# Patient Record
Sex: Male | Born: 1990 | Race: Black or African American | Hispanic: No | Marital: Single | Smoking: Never smoker
Health system: Southern US, Community
[De-identification: ages and names within clinical notes are randomized; demographics above are authoritative.]

---

## 2015-06-19 ENCOUNTER — Emergency Department (INDEPENDENT_AMBULATORY_CARE_PROVIDER_SITE_OTHER): Payer: Medicaid Other

## 2015-06-19 ENCOUNTER — Emergency Department (INDEPENDENT_AMBULATORY_CARE_PROVIDER_SITE_OTHER)
Admission: EM | Admit: 2015-06-19 | Discharge: 2015-06-19 | Disposition: A | Discharge status: Home / Self Care | Payer: Medicaid Other | Source: Home / Self Care | Attending: Emergency Medicine | Admitting: Emergency Medicine

## 2015-06-19 ENCOUNTER — Encounter (HOSPITAL_COMMUNITY): Payer: Self-pay | Admitting: Emergency Medicine

## 2015-06-19 DIAGNOSIS — M7652 Patellar tendinitis, left knee: Secondary | ICD-10-CM

## 2015-06-19 DIAGNOSIS — S8992XA Unspecified injury of left lower leg, initial encounter: Secondary | ICD-10-CM

## 2015-06-19 DIAGNOSIS — M25562 Pain in left knee: Secondary | ICD-10-CM

## 2015-06-19 MED ORDER — MELOXICAM 15 MG PO TABS: 15.0000 mg | ORAL_TABLET | Freq: Every day | ORAL | Status: DC

## 2015-06-19 NOTE — ED Provider Notes (Signed)
CSN: [id number]     Arrival date & time 06/19/15  1309 History   First MD Initiated Contact with Patient 06/19/15 1400     Chief Complaint  Patient presents with  . Knee Pain   (Consider location/radiation/quality/duration/timing/severity/associated sxs/prior Treatment) HPI He is a 25 year old man here for evaluation of left knee pain. A phone interpreter was used for this encounter. He states he hit his left knee on a chair about a week ago. He developed immediate pain inside the knee. He denies any swelling. He has had persistent pain. It is worse with weightbearing, particularly going up and down stairs. He has not tried any medicines. He is walking with a cane.  History reviewed. No pertinent past medical history. History reviewed. No pertinent past surgical history. No family history on file. Social History  Substance Use Topics  . Smoking status: Never Smoker   . Smokeless tobacco: None  . Alcohol Use: No    Review of Systems As in history of present illness Allergies  Review of patient's allergies indicates no known allergies.  Home Medications   Prior to Admission medications   Medication Sig Start Date End Date Taking? Authorizing Provider  OVER THE COUNTER MEDICATION vicks cream   Yes Historical Provider, MD  meloxicam (MOBIC) 15 MG tablet Take 1 tablet (15 mg total) by mouth daily. For 1 week, then as needed for pain 06/19/15   Charm Rings, MD   Meds Ordered and Administered this Visit  Medications - No data to display  BP 137/84 mmHg  Pulse 66  Temp(Src) 98.9 F (37.2 C)  Resp 16  SpO2 100% No data found.   Physical Exam  Constitutional: He is oriented to person, place, and time. He appears well-developed and well-nourished. No distress.  Cardiovascular: Normal rate.   Pulmonary/Chest: Effort normal.  Musculoskeletal:  Left knee: No erythema or edema. No appreciable joint effusion. He has full active range of motion, but pain with extension. He is tender  at the tibial tuberosity, over the patella, and the medial and lateral joint lines. No joint laxity.  Neurological: He is alert and oriented to person, place, and time.    ED Course  Procedures (including critical care time)  Labs Review Labs Reviewed - No data to display  Imaging Review Dg Knee Complete 4 Views Left  06/19/2015  CLINICAL DATA:  Bumped LEFT knee on edge of wooden table last week, pain at patella, knee injury, initial encounter EXAM: LEFT KNEE - COMPLETE 4+ VIEW COMPARISON:  None FINDINGS: Bone mineralization normal. Joint spaces preserved. No fracture, dislocation, or bone destruction. No joint effusion. IMPRESSION: Normal exam. Electronically Signed   By: Ulyses Southward M.D.   On: 06/19/2015 14:57      MDM   1. Left knee pain   2. Patellar tendinitis, left    X-ray negative for fracture. History and exam consistent with patellar tendinopathy. Treat with knee sleeve, rest, ice, and meloxicam. Expect gradual improvement over the next 2 weeks. Follow-up as needed.    Charm Rings, MD 06/19/15 [phone]

## 2015-06-19 NOTE — Discharge Instructions (Signed)
You have bruised your patellar tendon. Wear the knee sleeve for the next week. Ice your knee for 20 minutes at least 3 times a day. Do this after any sort of activity. Take meloxicam daily for 1 week, then as needed. This will help with the pain. If this is not improving in 2 weeks, please come back.  Vous avez bris votre tendon rotulien. Portez la [employer] genou pour la semaine suivante. Glacez votre genou pendant 20 minutes au moins 3 fois par jour. Faites ceci aprs n'importe quelle sorte d'activit. Prenez le meloxicam quotidiennement pendant 1 semaine, puis au besoin. [employer]. Si cela ne s'amliore pas dans 2 semaines, s'il vous plat revenir.
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
## 2015-06-19 NOTE — ED Notes (Signed)
[place]rived [place] 03[place]

## 2015-06-19 NOTE — ED Notes (Signed)
Note from congregational nurse sent for scanning into record
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
## 2015-06-20 DIAGNOSIS — S8992XA Unspecified injury of left lower leg, initial encounter: Secondary | ICD-10-CM

## 2015-06-26 NOTE — Congregational Nurse Program (Signed)
Congregational Nurse Program Note  Date of Encounter: 06/20/2015  Past Medical History: No past medical history on file.  Encounter Details:     CNP Questionnaire - 06/20/15 2342    Patient Demographics   Is this a new or existing patient? New   Patient is considered a/an Refugee   Race African   Patient Assistance   Location of Patient Assistance Not Applicable   Patient's financial/insurance status Medicaid   Uninsured Patient No   Patient referred to apply for the following financial assistance Not Applicable   Food insecurities addressed Not Applicable   Transportation assistance Yes   Type of Assistance Taxi Voucher Given   Assistance securing medications No   Educational health offerings Navigating the healthcare system;Safety   Encounter Details   Primary purpose of visit Acute Illness/Condition Visit;Navigating the Healthcare System   Was an Emergency Department visit averted? Not Applicable   Does patient have a medical provider? No   Patient referred to Urgent Care   Was a mental health screening completed? (GAINS tool) No   Does patient have dental issues? No   Does patient have vision issues? No   Since previous encounter, have you referred patient for abnormal blood pressure that resulted in a new diagnosis or medication change? No   Since previous encounter, have you referred patient for abnormal blood glucose that resulted in a new diagnosis or medication change? No     Office visit to confirm [facility] and [facility].Reports knee improved with medication, Meloxicam, ice application, elevation and brace on left knee                 .Plan:  Reinforced treatment and return as needed. Assist in locating PCP. Ferol LuzMarietta Norbert Malkin, RN/CN

## 2015-07-17 ENCOUNTER — Encounter (HOSPITAL_COMMUNITY): Payer: Self-pay | Admitting: Emergency Medicine

## 2015-07-17 ENCOUNTER — Emergency Department (INDEPENDENT_AMBULATORY_CARE_PROVIDER_SITE_OTHER)
Admission: EM | Admit: 2015-07-17 | Discharge: 2015-07-17 | Disposition: A | Discharge status: Home / Self Care | Payer: Self-pay | Source: Home / Self Care | Attending: Family Medicine | Admitting: Family Medicine

## 2015-07-17 ENCOUNTER — Emergency Department (INDEPENDENT_AMBULATORY_CARE_PROVIDER_SITE_OTHER): Payer: Medicaid Other

## 2015-07-17 DIAGNOSIS — T148 Other injury of unspecified body region: Secondary | ICD-10-CM | POA: Diagnosis not present

## 2015-07-17 DIAGNOSIS — T148XXA Other injury of unspecified body region, initial encounter: Secondary | ICD-10-CM

## 2015-07-17 NOTE — Discharge Instructions (Signed)
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
## 2015-12-16 ENCOUNTER — Emergency Department (HOSPITAL_COMMUNITY): Payer: No Typology Code available for payment source

## 2015-12-16 ENCOUNTER — Emergency Department (HOSPITAL_COMMUNITY)
Admission: EM | Admit: 2015-12-16 | Discharge: 2015-12-16 | Disposition: A | Discharge status: Home / Self Care | Payer: No Typology Code available for payment source | Attending: Emergency Medicine | Admitting: Emergency Medicine

## 2015-12-16 ENCOUNTER — Encounter (HOSPITAL_COMMUNITY): Payer: Self-pay | Admitting: *Deleted

## 2015-12-16 DIAGNOSIS — S8991XA Unspecified injury of right lower leg, initial encounter: Secondary | ICD-10-CM | POA: Diagnosis not present

## 2015-12-16 DIAGNOSIS — Y999 Unspecified external cause status: Secondary | ICD-10-CM | POA: Diagnosis not present

## 2015-12-16 DIAGNOSIS — Y9389 Activity, other specified: Secondary | ICD-10-CM | POA: Diagnosis not present

## 2015-12-16 DIAGNOSIS — M25561 Pain in right knee: Secondary | ICD-10-CM | POA: Diagnosis present

## 2015-12-16 DIAGNOSIS — Y92410 Unspecified street and highway as the place of occurrence of the external cause: Secondary | ICD-10-CM | POA: Insufficient documentation

## 2015-12-16 DIAGNOSIS — Z79899 Other long term (current) drug therapy: Secondary | ICD-10-CM | POA: Insufficient documentation

## 2015-12-16 MED ORDER — NAPROXEN 500 MG PO TABS: 500.0000 mg | ORAL_TABLET | Freq: Two times a day (BID) | ORAL | 0 refills | Status: DC

## 2015-12-16 MED ORDER — OXYCODONE-ACETAMINOPHEN 5-325 MG PO TABS
1.0000 | ORAL_TABLET | Freq: Once | ORAL | Status: AC
  Administered 2015-12-16: 1 via ORAL
  Filled 2015-12-16: qty 1

## 2015-12-16 MED ORDER — HYDROCODONE-ACETAMINOPHEN 5-325 MG PO TABS: 1.0000 | ORAL_TABLET | Freq: Four times a day (QID) | ORAL | 0 refills | Status: DC | PRN

## 2015-12-16 MED ORDER — KETOROLAC TROMETHAMINE 60 MG/2ML IM SOLN
60.0000 mg | Freq: Once | INTRAMUSCULAR | Status: AC
  Administered 2015-12-16: 60 mg via INTRAMUSCULAR
  Filled 2015-12-16: qty 2
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
## 2015-12-16 NOTE — ED Notes (Signed)
Bed: [id number] Expected date:  Expected time:  Means of arrival:  Comments: EMS-fast track

## 2015-12-16 NOTE — Discharge Instructions (Signed)
There were no abnormalities on your x-ray. Follow-up with orthopedics as needed for reevaluation and chronic management of this injury Expect your soreness to increase over the next 2-3 days. Take it easy, but do not lay around too much as this may make the stiffness worse. Take 500 mg of naproxen every 12 hours or 800 mg of ibuprofen every 8 hours for the next 3 days. Take these medications with food to avoid upset stomach. Vicodin for severe pain. Do not take the Vicodin while driving or performing other dangerous activities.
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
## 2017-04-22 ENCOUNTER — Other Ambulatory Visit: Payer: Self-pay

## 2017-04-22 ENCOUNTER — Ambulatory Visit (HOSPITAL_COMMUNITY)
Admission: EM | Admit: 2017-04-22 | Discharge: 2017-04-22 | Disposition: A | Discharge status: Home / Self Care | Payer: BLUE CROSS/BLUE SHIELD | Attending: Family Medicine | Admitting: Family Medicine

## 2017-04-22 ENCOUNTER — Encounter (HOSPITAL_COMMUNITY): Payer: Self-pay | Admitting: Emergency Medicine

## 2017-04-22 DIAGNOSIS — J029 Acute pharyngitis, unspecified: Secondary | ICD-10-CM | POA: Diagnosis present

## 2017-04-22 LAB — POCT RAPID STREP A: Streptococcus, Group A Screen (Direct): NEGATIVE

## 2017-04-22 NOTE — ED Triage Notes (Signed)
Pt has been suffering from a fever and sore throat for four days.  He was seen by an RN at his work and he is here today to be cleared to work or to be excused based on our final diagnosis.  Pt has paperwork with him to bring back to work.

## 2017-04-25 LAB — CULTURE, GROUP A STREP (THRC)

## 2017-04-25 NOTE — ED Provider Notes (Signed)
  [facility]   [id number] 04/22/17 Arrival Time: 1318  ASSESSMENT & PLAN:  1. Sore throat     Results for orders placed or performed during the hospital encounter of 04/22/17  Culture, group A strep  POCT rapid strep A [facility])  Result Value Ref Range   Streptococcus, Group A Screen (Direct) NEGATIVE NEGATIVE   Labs Reviewed  CULTURE, GROUP A STREP [facility])  POCT RAPID STREP A   OTC analgesics and throat care as needed. Will notify of any positive culture results. Work note forms completed. Will follow up if not showing significant improvement over the next 24-48 hours.  Reviewed expectations re: course of current medical issues. Questions answered. Outlined signs and symptoms indicating need for more acute intervention. Patient verbalized understanding. After Visit Summary given.   SUBJECTIVE:  Sean Weiss is a 27 y.o. male who reports a sore throat. Describes as sharp. Onset abrupt beginning 4 days ago. No respiratory symptoms. Normal PO intake but reports discomfort with swallowing. No associated n/v/abdominal symptoms. Sick contacts: None. OTC Tylenol with mild help. No specific worsening of current symptoms.   ROS: As per HPI.   OBJECTIVE:  Vitals:   04/22/17 1415  BP: 127/81  Pulse: 68  Temp: 98.6 F (37 C)  TempSrc: Oral  SpO2: 100%     General appearance: alert; no distress HEENT: throat with moderate erythema Neck: supple with FROM; no cervical LAD Lungs: clear to auscultation bilaterally Skin: reveals no rash; warm and dry Psychological: alert and cooperative; normal mood and affect  No Known Allergies   Social History   Socioeconomic History  . Marital status: Single    Spouse name: Not on file  . Number of children: Not on file  . Years of education: Not on file  . Highest education level: Not on file  Social Needs  . Financial resource strain: Not on file  . Food insecurity - worry: Not on file  . Food  insecurity - inability: Not on file  . Transportation needs - medical: Not on file  . Transportation needs - non-medical: Not on file  Occupational History  . Not on file  Tobacco Use  . Smoking status: Never Smoker  . Smokeless tobacco: Never Used  Substance and Sexual Activity  . Alcohol use: No  . Drug use: No  . Sexual activity: Not on file  Other Topics Concern  . Not on file  Social History Narrative  . Not on file          Mardella LaymanHagler, Doyl Bitting, MD 04/25/17 1357
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
## 2017-07-28 ENCOUNTER — Encounter (HOSPITAL_COMMUNITY): Payer: Self-pay | Admitting: Emergency Medicine

## 2017-07-28 ENCOUNTER — Ambulatory Visit (HOSPITAL_COMMUNITY)
Admission: EM | Admit: 2017-07-28 | Discharge: 2017-07-28 | Disposition: A | Discharge status: Home / Self Care | Payer: BLUE CROSS/BLUE SHIELD | Attending: Family Medicine | Admitting: Family Medicine

## 2017-07-28 ENCOUNTER — Other Ambulatory Visit: Payer: Self-pay

## 2017-07-28 DIAGNOSIS — Z791 Long term (current) use of non-steroidal anti-inflammatories (NSAID): Secondary | ICD-10-CM | POA: Diagnosis not present

## 2017-07-28 DIAGNOSIS — B9789 Other viral agents as the cause of diseases classified elsewhere: Secondary | ICD-10-CM | POA: Diagnosis not present

## 2017-07-28 DIAGNOSIS — R05 Cough: Secondary | ICD-10-CM | POA: Insufficient documentation

## 2017-07-28 DIAGNOSIS — Z79899 Other long term (current) drug therapy: Secondary | ICD-10-CM | POA: Diagnosis not present

## 2017-07-28 DIAGNOSIS — J029 Acute pharyngitis, unspecified: Secondary | ICD-10-CM | POA: Diagnosis not present

## 2017-07-28 DIAGNOSIS — J069 Acute upper respiratory infection, unspecified: Secondary | ICD-10-CM | POA: Diagnosis not present

## 2017-07-28 DIAGNOSIS — R490 Dysphonia: Secondary | ICD-10-CM | POA: Diagnosis not present

## 2017-07-28 LAB — POCT RAPID STREP A: Streptococcus, Group A Screen (Direct): NEGATIVE

## 2017-07-28 NOTE — ED Provider Notes (Signed)
[facility]    CSN: [id number] Arrival date & time: 07/28/17  1300     History   Chief Complaint Chief Complaint  Patient presents with  . URI    HPI Leyland Nancie Neaslunga Servantes is a 27 y.o. male presenting today with sore throat.  States that for the past 4 days he has had a sore throat, cough, congestion and runny nose.  Sore throat is his main complaint.  Feels like his voice has become slightly hoarse.  He was tried ibuprofen.  He was measured to have a fever while at work.  He works for [employer].  Denies nausea, vomiting, abdominal pain.  Denies diarrhea.  HPI  History reviewed. No pertinent past medical history.  There are no active problems to display for this patient.   History reviewed. No pertinent surgical history.     Home Medications    Prior to Admission medications   Medication Sig Start Date End Date Taking? Authorizing Provider  HYDROcodone-acetaminophen (NORCO/VICODIN) 5-325 MG tablet Take 1 tablet by mouth every 6 (six) hours as needed. 12/16/15   Joy, Shawn C, PA-C  meloxicam (MOBIC) 15 MG tablet Take 1 tablet (15 mg total) by mouth daily. For 1 week, then as needed for pain 06/19/15   Charm RingsHonig, Erin J, MD  naproxen (NAPROSYN) 500 MG tablet Take 1 tablet (500 mg total) by mouth 2 (two) times daily. 12/16/15   Joy, Hillard DankerShawn C, PA-C  OVER THE COUNTER MEDICATION vicks cream    [provider]    Family History History reviewed. No pertinent family history.  Social History Social History   Tobacco Use  . Smoking status: Never Smoker  . Smokeless tobacco: Never Used  Substance Use Topics  . Alcohol use: No  . Drug use: No     Allergies   Patient has no known allergies.   Review of Systems Review of Systems  Constitutional: Negative for activity change, appetite change, fatigue and fever.  HENT: Positive for congestion, rhinorrhea, sore throat and voice change. Negative for ear pain, postnasal drip and sinus pressure.   Eyes: Negative for  pain and itching.  Respiratory: Positive for cough. Negative for shortness of breath.   Cardiovascular: Negative for chest pain.  Gastrointestinal: Negative for abdominal pain, diarrhea, nausea and vomiting.  Musculoskeletal: Negative for myalgias.  Skin: Negative for rash.  Neurological: Negative for dizziness, light-headedness and headaches.     Physical Exam Triage Vital Signs ED Triage Vitals  Enc Vitals Group     BP 07/28/17 1318 121/83     Pulse Rate 07/28/17 1318 67     Resp --      Temp 07/28/17 1318 98.2 F (36.8 C)     Temp Source 07/28/17 1318 Oral     SpO2 07/28/17 1318 98 %     Weight --      Height --      Head Circumference --      Peak Flow --      Pain Score 07/28/17 1317 2     Pain Loc --      Pain Edu? --      Excl. in GC? --    No data found.  Updated Vital Signs BP 121/83 (BP Location: Right Arm)   Pulse 67   Temp 98.2 F (36.8 C) (Oral)   SpO2 98%   Visual Acuity Right Eye Distance:   Left Eye Distance:   Bilateral Distance:    Right Eye Near:   Left  Eye Near:    Bilateral Near:     Physical Exam  Constitutional: He appears well-developed and well-nourished.  HENT:  Head: Normocephalic and atraumatic.  Bilateral TMs nonerythematous, nasal mucosa erythematous, no rhinorrhea present.  Posterior oropharynx erythematous, no tonsillar enlargement or exudate.  Eyes: Conjunctivae are normal.  Neck: Neck supple.  Cardiovascular: Normal rate and regular rhythm.  No murmur heard. Pulmonary/Chest: Effort normal and breath sounds normal. No respiratory distress.  Breathing comfortably at rest, CTA BL  Abdominal: Soft. There is no tenderness.  Musculoskeletal: He exhibits no edema.  Neurological: He is alert.  Skin: Skin is warm and dry.  Psychiatric: He has a normal mood and affect.  Nursing note and vitals reviewed.    UC Treatments / Results  Labs (all labs ordered are listed, but only abnormal results are displayed) Labs Reviewed    CULTURE, GROUP A STREP [facility])    EKG None Radiology No results found.  Procedures Procedures (including critical care time)  Medications Ordered in UC Medications - No data to display   Initial Impression / Assessment and Plan / UC Course  I have reviewed the triage vital signs and the nursing notes.  Pertinent labs & imaging results that were available during my care of the patient were reviewed by me and considered in my medical decision making (see chart for details).     Strep test negative.  Vital signs stable.  Likely viral URI will recommend symptomatic management.  Discussed with patient over-the-counter measures to better control of symptoms.  Discussed strict return precautions. Patient verbalized understanding and is agreeable with plan.   Final Clinical Impressions(s) / UC Diagnoses   Final diagnoses:  Viral URI with cough    ED Discharge Orders    None       Controlled Substance Prescriptions [place] Controlled Substance Registry consulted? Not Applicable   Lew Dawes, [place] 07/28/17 1407

## 2017-07-28 NOTE — ED Triage Notes (Signed)
Pt states he has had a cough, nasal congestion and sore throat for four days.  He has a return to work release from [employer] that states he has had a sore throat and fever, but does not state what the fever might have been.

## 2017-07-28 NOTE — Discharge Instructions (Addendum)
Sore Throat  Your rapid strep tested Negative today. We will send for a culture and call in about 2 days if results are positive. For now we will treat your sore throat as a virus with symptom management.   Please continue Tylenol or Ibuprofen for fever and pain. May try salt water gargles, cepacol lozenges, throat spray, or OTC cold relief medicine for throat discomfort. If you also have congestion take a daily anti-histamine like Zyrtec, Claritin, and a oral decongestant to help with post nasal drip that may be irritating your throat.   Stay hydrated and drink plenty of fluids to keep your throat coated relieve irritation.   For congestion please begin daily allergy pill like Zyrtec or Claritin, please also use Flonase nasal spray.

## 2017-07-30 LAB — CULTURE, GROUP A STREP (THRC)

## 2018-01-29 ENCOUNTER — Ambulatory Visit (HOSPITAL_COMMUNITY)
Admission: EM | Admit: 2018-01-29 | Discharge: 2018-01-29 | Disposition: A | Discharge status: Home / Self Care | Payer: BLUE CROSS/BLUE SHIELD | Attending: Family Medicine | Admitting: Family Medicine

## 2018-01-29 ENCOUNTER — Encounter (HOSPITAL_COMMUNITY): Payer: Self-pay | Admitting: Family Medicine

## 2018-01-29 DIAGNOSIS — K529 Noninfective gastroenteritis and colitis, unspecified: Secondary | ICD-10-CM | POA: Diagnosis not present

## 2018-01-29 MED ORDER — LOPERAMIDE HCL 2 MG PO CAPS: 2.0000 mg | ORAL_CAPSULE | Freq: Four times a day (QID) | ORAL | 0 refills | Status: DC | PRN

## 2018-01-29 NOTE — ED Triage Notes (Signed)
Pt here for generalized abd pain

## 2018-01-29 NOTE — ED Provider Notes (Signed)
[facility]    CSN: [id number] Arrival date & time: 01/29/18  1413     History   Chief Complaint Chief Complaint  Patient presents with  . Abdominal Pain    HPI Sean Weiss is a 27 y.o. male.   This is a 27 year old man who complains of abdominal pain and diarrhea since Monday (4 days).  Is been no blood in the stool has not had a fever.  Patient works at [employer] working on [employer].  He had a meal in [place] last week that he thinks may have precipitated this illness.  His younger brother had some of the same symptoms but he is not a sick patient has been able to keep fluids down and has had no significant nausea.  His abdominal pain is epigastric and lower abdominal.     History reviewed. No pertinent past medical history.  There are no active problems to display for this patient.   History reviewed. No pertinent surgical history.     Home Medications    Prior to Admission medications   Medication Sig Start Date End Date Taking? Authorizing Provider  loperamide (IMODIUM) 2 MG capsule Take 1 capsule (2 mg total) by mouth 4 (four) times daily as needed for diarrhea or loose stools. 01/29/18   Elvina Sidle, MD  OVER THE COUNTER MEDICATION vicks cream    [provider]    Family History History reviewed. No pertinent family history.  Social History Social History   Tobacco Use  . Smoking status: Never Smoker  . Smokeless tobacco: Never Used  Substance Use Topics  . Alcohol use: No  . Drug use: No     Allergies   Patient has no known allergies.   Review of Systems Review of Systems  Gastrointestinal: Positive for abdominal pain and diarrhea. Negative for vomiting.  All other systems reviewed and are negative.    Physical Exam Triage Vital Signs ED Triage Vitals  Enc Vitals Group     BP      Pulse      Resp      Temp      Temp src      SpO2      Weight      Height      Head Circumference       Peak Flow      Pain Score      Pain Loc      Pain Edu?      Excl. in GC?    No data found.  Updated Vital Signs BP (!) 148/95 (BP Location: Left Arm)   Pulse 76   Temp 98.3 F (36.8 C) (Oral)   Resp 18   SpO2 99%    Physical Exam  Constitutional: He appears well-developed and well-nourished.  HENT:  Head: Normocephalic.  Mouth/Throat: Oropharynx is clear and moist.  Eyes: Pupils are equal, round, and reactive to light.  Cardiovascular: Normal rate, regular rhythm and normal heart sounds.  Pulmonary/Chest: Effort normal and breath sounds normal.  Abdominal: Soft. Normal appearance. Bowel sounds are increased. There is generalized tenderness. There is no rigidity, no rebound and no guarding.  Neurological: He is alert.  Skin: Skin is warm.  Nursing note and vitals reviewed.    UC Treatments / Results  Labs (all labs ordered are listed, but only abnormal results are displayed) Labs Reviewed - No data to display  EKG None  Radiology No results found.  Procedures Procedures (including critical  care time)  Medications Ordered in UC Medications - No data to display  Initial Impression / Assessment and Plan / UC Course  I have reviewed the triage vital signs and the nursing notes.  Pertinent labs & imaging results that were available during my care of the patient were reviewed by me and considered in my medical decision making (see chart for details).     Final Clinical Impressions(s) / UC Diagnoses   Final diagnoses:  Noninfectious gastroenteritis, unspecified type     Discharge Instructions     Avoid milk, cheese, and greasy foods for the next 2 days.  Instead focus on clear liquids, crackers and toast.    ED Prescriptions    Medication Sig Dispense Auth. Provider   loperamide (IMODIUM) 2 MG capsule Take 1 capsule (2 mg total) by mouth 4 (four) times daily as needed for diarrhea or loose stools. 12 capsule Elvina Sidle, MD     Controlled  Substance Prescriptions [place] Controlled Substance Registry consulted? Not Applicable   Elvina Sidle, MD 01/29/18 1511

## 2018-01-29 NOTE — Discharge Instructions (Addendum)
Avoid milk, cheese, and greasy foods for the next 2 days.  Instead focus on clear liquids, crackers and toast.

## 2023-08-24 NOTE — Progress Notes (Signed)
 Workers Comp Follow-up Visit:  Return to Work Flowsheet <redacted file path>  Date of Injury: 05/08/23   Employer:  [employer]  Last office visit date: 08/10/23.  Total office visits for this injury (not including today): 9  Briefly describe the injury: Initial: 05/08/23 - presents complaining of pain in right lower back.  Reports he was at work lifting parts onto pallets when he immediately felt pain in his low back.  States he was lifting approximately 35 pounds at the time.  Describes pain as feeling like something is grabbing him.  Reports he cannot comfortably move in any direction. States the pain gets worse when his right leg touches the ground. Denies radiating pain, numbness or tingling.   Has been seen numerous times with slow improvement.  Has been going to PT.  On 3/10 an ortho referral was placed, on 4/21 another ortho referral was placed and MRI ordered.  Pt has not heard anything.   He reports that he is still unable to pick up his child without feeling the pain from his right lower back. When he is still, he is in no pain. With movements, his pain begins.  Has no radiating to his lower legs. Denies any numbness, tingling or radiation.  Denies bladder or bowel dysfunction.   Treatment rendered thus far: ice, flexeril, naproxen , tylenol , PT, light duty  Is the treatment improving the injury: yes  Current work restrictions: Alternate repetitive motion and non-repetitive motion activities, Alternate sitting and standing No lifting/pulling/pushing over (lbs/kg): 6.804 kg (15 lb)    Is patient tolerating work restrictions: yes  Percent improvement overall from the date of injury (0-100%): 80% - stalled since last month  Referrals completed/pending: ortho and MRI  Allergy and Medication history documented by clinical staff have been reviewed with the patient and I find no changes 08/24/2023  Past, Family and Social History documented by the clinical staff have been reviewed with  the patient and I find no changes 08/24/2023   Vitals:   08/24/23 1342  BP: 138/84  BP Location: Left arm  Patient Position: Sitting  Pulse: 82  SpO2: 100%  Weight: 116 kg (256 lb)  Height: 1.727 m (5' 8)    Physical Exam Constitutional:      General: He is not in acute distress.    Appearance: Normal appearance. He is obese.  HENT:     Head: Normocephalic and atraumatic.  Eyes:     Conjunctiva/sclera: Conjunctivae normal.  Cardiovascular:     Rate and Rhythm: Normal rate and regular rhythm.     Pulses: Normal pulses.     Heart sounds: Normal heart sounds.  Pulmonary:     Effort: Pulmonary effort is normal.     Breath sounds: Normal breath sounds.  Musculoskeletal:     Lumbar back: Tenderness present. No swelling, deformity or spasms. Normal range of motion. Negative right straight leg raise test and negative left straight leg raise test.     Comments: Tenderness and Discomfort right lower back paraspinous, FROM with discomfort when leaning to the right, sensation, strength and DTR intact  Skin:    General: Skin is warm and dry.  Neurological:     General: No focal deficit present.     Mental Status: He is alert and oriented to person, place, and time.     Sensory: No sensory deficit.     Motor: No weakness.     Coordination: Coordination normal.     Gait: Gait normal.  Deep Tendon Reflexes: Reflexes normal.  Psychiatric:        Mood and Affect: Mood normal.        Behavior: Behavior normal.        Thought Content: Thought content normal.        Judgment: Judgment normal.            Assessment/Plan   Problem List Items Addressed This Visit   None Visit Diagnoses       Acute right-sided low back pain without sciatica    -  Primary        Medical Decision Making:    Apply ice to your right lower back for 15 to 20 minutes every 2 to 4 hours as needed. May use moist heat.   The patient may alternate OTC Tylenol  and Ibuprofen  as needed for pain. Do not  take more than the recommended amt.  Continue with physical therapy sessions - do home exercises RTC on 09/16/2023 or sooner if worsens for reevaluation. Cancel this appointment if he is able to get in with Orthopedic.  His initial orthopedic referral was on 06/29/2023 and he didn't see a orthopedic provider yet. placed another Orthopedic referral and MRI imaging on 4/28.  Follow light duty restrictions   Contact referral center to see about expediting referrals.                                    On the day of the visit I spent 35 minutes preparing to see the patient, obtaining and/or reviewing separately obtained history, performing a medically appropriate examination and evaluation, counseling and educating the patient/caregiver, documenting clinical information in the electronic medical record, and care coordination (not separately reported) .This time does not include any time spent performing procedures or assesments that are separately billable.

## 2024-02-27 ENCOUNTER — Other Ambulatory Visit: Payer: Self-pay

## 2024-02-27 ENCOUNTER — Emergency Department (HOSPITAL_COMMUNITY): Payer: Self-pay

## 2024-02-27 ENCOUNTER — Emergency Department (HOSPITAL_COMMUNITY): Payer: MEDICAID

## 2024-02-27 ENCOUNTER — Inpatient Hospital Stay (HOSPITAL_COMMUNITY)
Admission: EM | Admit: 2024-02-27 | Discharge: 2024-03-01 | DRG: 868 | Disposition: A | Discharge status: Home / Self Care | Payer: MEDICAID | Attending: Internal Medicine | Admitting: Internal Medicine

## 2024-02-27 ENCOUNTER — Encounter (HOSPITAL_COMMUNITY): Payer: Self-pay | Admitting: *Deleted

## 2024-02-27 DIAGNOSIS — B54 Unspecified malaria: Principal | ICD-10-CM | POA: Diagnosis present

## 2024-02-27 DIAGNOSIS — Z23 Encounter for immunization: Secondary | ICD-10-CM

## 2024-02-27 DIAGNOSIS — H109 Unspecified conjunctivitis: Secondary | ICD-10-CM | POA: Diagnosis present

## 2024-02-27 DIAGNOSIS — R748 Abnormal levels of other serum enzymes: Secondary | ICD-10-CM | POA: Diagnosis present

## 2024-02-27 DIAGNOSIS — D6959 Other secondary thrombocytopenia: Secondary | ICD-10-CM | POA: Diagnosis present

## 2024-02-27 DIAGNOSIS — Z1152 Encounter for screening for COVID-19: Secondary | ICD-10-CM

## 2024-02-27 DIAGNOSIS — D649 Anemia, unspecified: Secondary | ICD-10-CM | POA: Diagnosis present

## 2024-02-27 DIAGNOSIS — R7401 Elevation of levels of liver transaminase levels: Secondary | ICD-10-CM | POA: Diagnosis present

## 2024-02-27 DIAGNOSIS — N179 Acute kidney failure, unspecified: Secondary | ICD-10-CM | POA: Diagnosis present

## 2024-02-27 LAB — CBC WITH DIFFERENTIAL/PLATELET
Abs Immature Granulocytes: 0.06 K/uL (ref 0.00–0.07)
Basophils Absolute: 0 K/uL (ref 0.0–0.1)
Basophils Relative: 0 %
Eosinophils Absolute: 0 K/uL (ref 0.0–0.5)
Eosinophils Relative: 0 %
HCT: 42.2 % (ref 39.0–52.0)
Hemoglobin: 13.8 g/dL (ref 13.0–17.0)
Immature Granulocytes: 1 %
Lymphocytes Relative: 11 %
Lymphs Abs: 0.7 K/uL (ref 0.7–4.0)
MCH: 26.4 pg (ref 26.0–34.0)
MCHC: 32.7 g/dL (ref 30.0–36.0)
MCV: 80.7 fL (ref 80.0–100.0)
Monocytes Absolute: 0.5 K/uL (ref 0.1–1.0)
Monocytes Relative: 8 %
Neutro Abs: 5.4 K/uL (ref 1.7–7.7)
Neutrophils Relative %: 80 %
Platelets: 149 K/uL — ABNORMAL LOW (ref 150–400)
RBC: 5.23 MIL/uL (ref 4.22–5.81)
RDW: 14.8 % (ref 11.5–15.5)
WBC: 6.7 K/uL (ref 4.0–10.5)
nRBC: 0 % (ref 0.0–0.2)

## 2024-02-27 LAB — COMPREHENSIVE METABOLIC PANEL WITH GFR
ALT: 111 U/L — ABNORMAL HIGH (ref 0–44)
AST: 53 U/L — ABNORMAL HIGH (ref 15–41)
Albumin: 3.6 g/dL (ref 3.5–5.0)
Alkaline Phosphatase: 184 U/L — ABNORMAL HIGH (ref 38–126)
Anion gap: 13 (ref 5–15)
BUN: 15 mg/dL (ref 6–20)
CO2: 20 mmol/L — ABNORMAL LOW (ref 22–32)
Calcium: 9.2 mg/dL (ref 8.9–10.3)
Chloride: 101 mmol/L (ref 98–111)
Creatinine, Ser: 1.25 mg/dL — ABNORMAL HIGH (ref 0.61–1.24)
GFR, Estimated: >60 mL/min (ref >60)
Glucose, Bld: 127 mg/dL — ABNORMAL HIGH (ref 70–99)
Potassium: 3.5 mmol/L (ref 3.5–5.1)
Sodium: 133 mmol/L — ABNORMAL LOW (ref 135–145)
Total Bilirubin: 4.2 mg/dL — ABNORMAL HIGH (ref 0.0–1.2)
Total Protein: 7.9 g/dL (ref 6.5–8.1)

## 2024-02-27 LAB — I-STAT CG4 LACTIC ACID, ED
Lactic Acid, Venous: 2.7 mmol/L (ref 0.5–1.9)
Lactic Acid, Venous: 1.9 mmol/L (ref 0.5–1.9)

## 2024-02-27 LAB — PARASITE EXAM SCREEN, BLOOD-W CONF TO LABCORP (NOT @ ARMC)

## 2024-02-27 LAB — RESP PANEL BY RT-PCR (RSV, FLU A&B, COVID)  RVPGX2
Influenza A by PCR: NEGATIVE
Influenza B by PCR: NEGATIVE
Resp Syncytial Virus by PCR: NEGATIVE
SARS Coronavirus 2 by RT PCR: NEGATIVE

## 2024-02-27 LAB — HEPATITIS PANEL, ACUTE
HCV Ab: NONREACTIVE
Hep A IgM: NONREACTIVE
Hep B C IgM: NONREACTIVE
Hepatitis B Surface Ag: NONREACTIVE

## 2024-02-27 LAB — ACETAMINOPHEN LEVEL: Acetaminophen (Tylenol), Serum: <10 ug/mL — ABNORMAL LOW (ref 10–30)

## 2024-02-27 LAB — BILIRUBIN, DIRECT: Bilirubin, Direct: 2 mg/dL — ABNORMAL HIGH (ref 0.0–0.2)

## 2024-02-27 MED ORDER — ONDANSETRON HCL 4 MG/2ML IJ SOLN: 4.0000 mg | Freq: Four times a day (QID) | INTRAMUSCULAR | Status: DC | PRN

## 2024-02-27 MED ORDER — LACTATED RINGERS IV BOLUS
1000.0000 mL | Freq: Once | INTRAVENOUS | Status: AC
  Administered 2024-02-27: 1000 mL via INTRAVENOUS

## 2024-02-27 MED ORDER — ALBUTEROL SULFATE (2.5 MG/3ML) 0.083% IN NEBU
2.5000 mg | INHALATION_SOLUTION | RESPIRATORY_TRACT | Status: DC | PRN
Start: 2024-02-27 — End: 2024-03-01

## 2024-02-27 MED ORDER — ENOXAPARIN SODIUM 40 MG/0.4ML IJ SOSY
40.0000 mg | PREFILLED_SYRINGE | INTRAMUSCULAR | Status: DC
  Administered 2024-02-27 – 2024-02-29 (×3): 40 mg via SUBCUTANEOUS
  Filled 2024-02-27 (×3): qty 0.4

## 2024-02-27 MED ORDER — SODIUM CHLORIDE 0.9 % IV BOLUS
1000.0000 mL | Freq: Once | INTRAVENOUS | Status: AC
  Administered 2024-02-27: 1000 mL via INTRAVENOUS

## 2024-02-27 MED ORDER — ARTESUNATE 110 MG IV SOLR (NON-CDC SUPPLY)
2.4000 mg/kg | Freq: Two times a day (BID) | INTRAVENOUS | Status: AC
Start: 2024-02-27 — End: 2024-02-29
  Administered 2024-02-27 – 2024-02-28 (×3): 245 mg via INTRAVENOUS
  Filled 2024-02-27 (×3): qty 24.5

## 2024-02-27 MED ORDER — ACETAMINOPHEN 500 MG PO TABS
1000.0000 mg | ORAL_TABLET | Freq: Once | ORAL | Status: AC
  Administered 2024-02-27: 1000 mg via ORAL
  Filled 2024-02-27: qty 2

## 2024-02-27 MED ORDER — ACETAMINOPHEN 650 MG RE SUPP: 650.0000 mg | Freq: Four times a day (QID) | RECTAL | Status: DC | PRN

## 2024-02-27 MED ORDER — ONDANSETRON HCL 4 MG PO TABS: 4.0000 mg | ORAL_TABLET | Freq: Four times a day (QID) | ORAL | Status: DC | PRN

## 2024-02-27 MED ORDER — IBUPROFEN 200 MG PO TABS
600.0000 mg | ORAL_TABLET | Freq: Four times a day (QID) | ORAL | Status: DC | PRN
  Administered 2024-02-27 – 2024-02-28 (×2): 600 mg via ORAL
  Filled 2024-02-27 (×2): qty 3

## 2024-02-27 MED ORDER — SODIUM CHLORIDE 0.9 % IV SOLN: INTRAVENOUS | Status: AC

## 2024-02-27 MED ORDER — IBUPROFEN 200 MG PO TABS
400.0000 mg | ORAL_TABLET | Freq: Once | ORAL | Status: AC
  Administered 2024-02-27: 400 mg via ORAL
  Filled 2024-02-27: qty 2

## 2024-02-27 MED ORDER — SODIUM CHLORIDE 0.9 % IV SOLN
2.0000 g | INTRAVENOUS | Status: DC
  Administered 2024-02-28 – 2024-02-29 (×2): 2 g via INTRAVENOUS
  Filled 2024-02-27 (×3): qty 20

## 2024-02-27 MED ORDER — OXYMETAZOLINE HCL 0.05 % NA SOLN
1.0000 | Freq: Once | NASAL | Status: AC
Start: 2024-02-27 — End: 2024-02-27
  Administered 2024-02-27: 1 via NASAL
  Filled 2024-02-27: qty 30

## 2024-02-27 MED ORDER — ACETAMINOPHEN 325 MG PO TABS
650.0000 mg | ORAL_TABLET | Freq: Four times a day (QID) | ORAL | Status: DC | PRN
Start: 2024-02-27 — End: 2024-02-27

## 2024-02-27 MED ORDER — SODIUM CHLORIDE 0.9 % IV SOLN
2.0000 g | Freq: Once | INTRAVENOUS | Status: AC
  Administered 2024-02-27: 2 g via INTRAVENOUS
  Filled 2024-02-27: qty 20

## 2024-02-27 NOTE — H&P (Signed)
 History and Physical  Corie Trevell Pariseau FMW:[id number] DOB: 13-Jan-1991 DOA: 02/27/2024  PCP: Patient, No Pcp Per   Chief Complaint: Fever, myalgia  HPI: Sean Weiss is a 33 y.o. male without significant past medical history who presents with intermittent fever, myalgia, and jaundice after a 53-month stay in the [place] diagnosed with suspected malaria.  States that he went to visit the [place] for the first time in a decade, since he moved here with his family.  On his return trip a couple of weeks ago, he started to feel bad, with generalized malaise, some abdominal discomfort, and intermittent fevers.  Workup in the ER shows some mild renal insufficiency, bilirubinemia and abnormal LFTs.  Peripheral smear was reviewed by ID, and is concerning for malaria.  Review of Systems: Please see HPI for pertinent positives and negatives. A complete 10 system review of systems are otherwise negative.  History reviewed. No pertinent past medical history. History reviewed. No pertinent surgical history. Social History:  reports that he has never smoked. He has never used smokeless tobacco. He reports that he does not drink alcohol and does not use drugs.  No Known Allergies  History reviewed. No pertinent family history.   Prior to Admission medications   Medication Sig Start Date End Date Taking? Authorizing Provider  loperamide  (IMODIUM ) 2 MG capsule Take 1 capsule (2 mg total) by mouth 4 (four) times daily as needed for diarrhea or loose stools. 01/29/18   Mario Million, MD  OVER THE COUNTER MEDICATION vicks cream    [provider]    Physical Exam: BP 107/69 (BP Location: Left Arm)   Pulse 86   Temp 98.4 F (36.9 C) (Oral)   Resp (!) 22   Wt 102.1 kg   SpO2 100%  General:  Alert, oriented, calm, in no acute distress looks a little tired, his father is at the bedside Eyes: EOMI, clear conjuctivae, white sclerea Neck: supple, no masses, trachea mildline   Cardiovascular: RRR, no murmurs or rubs, no peripheral edema  Respiratory: clear to auscultation bilaterally, no wheezes, no crackles  Abdomen: soft, nontender, nondistended, normal bowel tones heard  Skin: dry, no rashes  Musculoskeletal: no joint effusions, normal range of motion  Psychiatric: appropriate affect, normal speech  Neurologic: extraocular muscles intact, clear speech, moving all extremities with intact sensorium         Labs on Admission:  Basic Metabolic Panel: Recent Labs  Lab 02/27/24 1351  NA 133*  K 3.5  CL 101  CO2 20*  GLUCOSE 127*  BUN 15  CREATININE 1.25*  CALCIUM 9.2   Liver Function Tests: Recent Labs  Lab 02/27/24 1351  AST 53*  ALT 111*  ALKPHOS 184*  BILITOT 4.2*  PROT 7.9  ALBUMIN 3.6   No results for input(s): LIPASE, AMYLASE in the last 168 hours. No results for input(s): AMMONIA in the last 168 hours. CBC: Recent Labs  Lab 02/27/24 1351  WBC 6.7  NEUTROABS 5.4  HGB 13.8  HCT 42.2  MCV 80.7  PLT 149*   Cardiac Enzymes: No results for input(s): CKTOTAL, CKMB, CKMBINDEX, TROPONINI in the last 168 hours. BNP (last 3 results) No results for input(s): BNP in the last 8760 hours.  ProBNP (last 3 results) No results for input(s): PROBNP in the last 8760 hours.  CBG: No results for input(s): GLUCAP in the last 168 hours.  Radiological Exams on Admission: US  Abdomen Limited RUQ (LIVER/GB) Result Date: 02/27/2024 EXAM: Right Upper Quadrant Abdominal Ultrasound 02/27/2024 02:19:22  PM TECHNIQUE: Real-time ultrasonography of the right upper quadrant of the abdomen was performed. COMPARISON: None available. CLINICAL HISTORY: Abdominal pain. FINDINGS: LIVER: The liver demonstrates normal echogenicity. No intrahepatic biliary ductal dilatation. No evidence of mass. Patent portal vein with antegrade flow. BILIARY SYSTEM: No pericholecystic fluid or wall thickening. No cholelithiasis. Negative sonographic Murphy's sign.  Common bile duct is within normal limits measuring 2 mm. OTHER: No right upper quadrant ascites. IMPRESSION: 1. No acute findings. Electronically signed by: Norman Gatlin MD 02/27/2024 02:48 PM EST RP Workstation: [id number]   DG Chest Portable 1 View Result Date: 02/27/2024 CLINICAL DATA:  Shortness of breath, cough, and fever. EXAM: PORTABLE CHEST 1 VIEW COMPARISON:  None Available. FINDINGS: The heart size and mediastinal contours are within normal limits. Lung volumes are low with mild atelectasis at the lung bases. No effusion or pneumothorax is seen. No acute osseous abnormality. IMPRESSION: Low lung volumes with atelectasis at the lung bases. Electronically Signed   By: Leita Birmingham M.D.   On: 02/27/2024 14:10   Assessment/Plan Lief Kartel Wolbert is a 33 y.o. male without significant past medical history who presents with intermittent fever, myalgia, and jaundice after a 1-month stay in the [place] diagnosed with suspected malaria.  Malaria-chart reviewed and orders placed by ID Dr. 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

## 2024-02-27 NOTE — ED Provider Notes (Signed)
  EMERGENCY DEPARTMENT AT [facility] Provider Note  CSN: [id number] Arrival date & time: 02/27/24 1307  Chief Complaint(s) Shortness of Breath  HPI Sean Weiss is a 33 y.o. male who is here today for 1 week of cough, fever, myalgias, jaundice.  Patient recently returned from [facility].  He did not take any prophylactic medications.  Patient is otherwise healthy.  He has been taking over-the-counter medicines at home.   Past Medical History History reviewed. No pertinent past medical history. There are no active problems to display for this patient.  Home Medication(s) Prior to Admission medications   Medication Sig Start Date End Date Taking? Authorizing Provider  loperamide  (IMODIUM ) 2 MG capsule Take 1 capsule (2 mg total) by mouth 4 (four) times daily as needed for diarrhea or loose stools. 01/29/18   Mario Million, MD  OVER THE COUNTER MEDICATION vicks cream    [provider]                                                                                                                                    Past Surgical History History reviewed. No pertinent surgical history. Family History History reviewed. No pertinent family history.  Social History Social History   Tobacco Use   Smoking status: Never   Smokeless tobacco: Never  Substance Use Topics   Alcohol use: No   Drug use: No   Allergies Patient has no known allergies.  Review of Systems Review of Systems  Physical Exam Vital Signs  I have reviewed the triage vital signs BP 107/69 (BP Location: Left Arm)   Pulse 86   Temp 98.4 F (36.9 C) (Oral)   Resp (!) 22   Wt 102.1 kg   SpO2 100%   Physical Exam Vitals and nursing note reviewed.  Constitutional:      Appearance: He is toxic-appearing.  HENT:     Head: Normocephalic.  Eyes:     Pupils: Pupils are equal, round, and reactive to light.     Comments: Scleral icterus  Cardiovascular:     Rate and Rhythm:  Tachycardia present.  Pulmonary:     Effort: Pulmonary effort is normal.     Breath sounds: No wheezing, rhonchi or rales.  Chest:     Chest wall: No mass.  Abdominal:     Palpations: Abdomen is soft. There is no mass.     Tenderness: There is no guarding.  Musculoskeletal:     Right lower leg: No edema.  Skin:    General: Skin is warm.     Findings: No rash.  Neurological:     General: No focal deficit present.     Mental Status: He is alert.     ED Results and Treatments Labs (all labs ordered are listed, but only abnormal results are displayed) Labs Reviewed  COMPREHENSIVE METABOLIC PANEL WITH GFR - Abnormal; Notable for the following components:  Result Value   Sodium 133 (*)    CO2 20 (*)    Glucose, Bld 127 (*)    Creatinine, Ser 1.25 (*)    AST 53 (*)    ALT 111 (*)    Alkaline Phosphatase 184 (*)    Total Bilirubin 4.2 (*)    All other components within normal limits  CBC WITH DIFFERENTIAL/PLATELET - Abnormal; Notable for the following components:   Platelets 149 (*)    All other components within normal limits  ACETAMINOPHEN  LEVEL - Abnormal; Notable for the following components:   Acetaminophen  (Tylenol ), Serum <10 (*)    All other components within normal limits  BILIRUBIN, DIRECT - Abnormal; Notable for the following components:   Bilirubin, Direct 2.0 (*)    All other components within normal limits  I-STAT CG4 LACTIC ACID, ED - Abnormal; Notable for the following components:   Lactic Acid, Venous 2.7 (*)    All other components within normal limits  RESP PANEL BY RT-PCR (RSV, FLU A&B, COVID)  RVPGX2  CULTURE, BLOOD (ROUTINE X 2)  CULTURE, BLOOD (ROUTINE X 2)  PLASMODIUM SP. PCR  PARASITE EXAM SCREEN, BLOOD-W CONF TO [facility] (NOT @ [facility])  HEPATITIS PANEL, ACUTE  I-STAT CG4 LACTIC ACID, ED                                                                                                                          Radiology US  Abdomen Limited RUQ  (LIVER/GB) Result Date: 02/27/2024 EXAM: Right Upper Quadrant Abdominal Ultrasound 02/27/2024 02:19:22 PM TECHNIQUE: Real-time ultrasonography of the right upper quadrant of the abdomen was performed. COMPARISON: None available. CLINICAL HISTORY: Abdominal pain. FINDINGS: LIVER: The liver demonstrates normal echogenicity. No intrahepatic biliary ductal dilatation. No evidence of mass. Patent portal vein with antegrade flow. BILIARY SYSTEM: No pericholecystic fluid or wall thickening. No cholelithiasis. Negative sonographic Murphy's sign. Common bile duct is within normal limits measuring 2 mm. OTHER: No right upper quadrant ascites. IMPRESSION: 1. No acute findings. Electronically signed by: Norman Gatlin MD 02/27/2024 02:48 PM EST RP Workstation: [id number]   DG Chest Portable 1 View Result Date: 02/27/2024 CLINICAL DATA:  Shortness of breath, cough, and fever. EXAM: PORTABLE CHEST 1 VIEW COMPARISON:  None Available. FINDINGS: The heart size and mediastinal contours are within normal limits. Lung volumes are low with mild atelectasis at the lung bases. No effusion or pneumothorax is seen. No acute osseous abnormality. IMPRESSION: Low lung volumes with atelectasis at the lung bases. Electronically Signed   By: Leita Birmingham M.D.   On: 02/27/2024 14:10    Pertinent labs & imaging results that were available during my care of the patient were reviewed by me and considered in my medical decision making (see MDM for details).  Medications Ordered in ED Medications  acetaminophen  (TYLENOL ) tablet 1,000 mg (1,000 mg Oral Given 02/27/24 1351)  ibuprofen (ADVIL) tablet 400 mg (400 mg Oral Given 02/27/24 1351)  sodium chloride 0.9 % bolus 1,000 mL (0  mLs Intravenous Stopped 02/27/24 1501)  cefTRIAXone (ROCEPHIN) 2 g in sodium chloride 0.9 % 100 mL IVPB (0 g Intravenous Stopped 02/27/24 1447)  oxymetazoline (AFRIN) 0.05 % nasal spray 1 spray (1 spray Each Nare Given 02/27/24 1434)  lactated ringers bolus 1,000 mL  (1,000 mLs Intravenous New Bag/Given 02/27/24 1512)                                                                                                                                     Procedures Procedures  (including critical care time)  Medical Decision Making / ED Course   This patient presents to the ED for concern of fever and myalgias, this involves an extensive number of treatment options, and is a complaint that carries with it a high risk of complications and morbidity.  The differential diagnosis includes mosquito borne illness, malaria, yellow fever, viral syndrome, sepsis, pneumonia.  MDM: The patient's recent travel, do have some concern for mosquito borne illness.  He did not receive any prophylactic pretreatment.  Certainly of concern for malaria, with his jaundice, do of concern for yellow fever.  Will provide the patient with some antipyretics, check labs, blood cultures lactic acid.  IV fluids ordered.  Given the patient's history, I think this is more likely to be a viral illness rather than bacterial sepsis, will hold off on antibiotics until we have more information.  Reassessment 4:45 PM-spoke with Dr. Luiz from infectious diseases.  During workup, he recommended providing the patient with 2 g of Rocephin.  Patient's smear was reviewed by Dr. Luiz and is consistent with malaria.  She is putting an additional orders for the patient.  Recommends admitting to hospitalist service.  Discussed this with the patient and family at bedside.  This patient does not have sepsis.   Additional history obtained: -Additional history obtained from family at bedside -External records from outside source obtained and reviewed including: Chart review including previous notes, labs, imaging, consultation notes   Lab Tests: -I ordered, reviewed, and interpreted labs.   The pertinent results include:   Labs Reviewed  COMPREHENSIVE METABOLIC PANEL WITH GFR - Abnormal; Notable for the  following components:      Result Value   Sodium 133 (*)    CO2 20 (*)    Glucose, Bld 127 (*)    Creatinine, Ser 1.25 (*)    AST 53 (*)    ALT 111 (*)    Alkaline Phosphatase 184 (*)    Total Bilirubin 4.2 (*)    All other components within normal limits  CBC WITH DIFFERENTIAL/PLATELET - Abnormal; Notable for the following components:   Platelets 149 (*)    All other components within normal limits  ACETAMINOPHEN  LEVEL - Abnormal; Notable for the following components:   Acetaminophen  (Tylenol ), Serum <10 (*)    All other components within normal limits  BILIRUBIN, DIRECT - Abnormal; Notable for the following components:  Bilirubin, Direct 2.0 (*)    All other components within normal limits  I-STAT CG4 LACTIC ACID, ED - Abnormal; Notable for the following components:   Lactic Acid, Venous 2.7 (*)    All other components within normal limits  RESP PANEL BY RT-PCR (RSV, FLU A&B, COVID)  RVPGX2  CULTURE, BLOOD (ROUTINE X 2)  CULTURE, BLOOD (ROUTINE X 2)  PLASMODIUM SP. PCR  PARASITE EXAM SCREEN, BLOOD-W CONF TO [facility] (NOT @ [facility])  HEPATITIS PANEL, ACUTE  I-STAT CG4 LACTIC ACID, ED    Imaging Studies ordered: I ordered imaging studies including right upper quadrant ultrasound, chest x-ray I independently visualized and interpreted imaging. I agree with the radiologist interpretation   Medicines ordered and prescription drug management: Meds ordered this encounter  Medications   acetaminophen  (TYLENOL ) tablet 1,000 mg   ibuprofen (ADVIL) tablet 400 mg   sodium chloride 0.9 % bolus 1,000 mL   cefTRIAXone (ROCEPHIN) 2 g in sodium chloride 0.9 % 100 mL IVPB    Antibiotic Indication::   Other Indication (list below)    Other Indication::   sepsis   oxymetazoline (AFRIN) 0.05 % nasal spray 1 spray   lactated ringers bolus 1,000 mL    -I have reviewed the patients home medicines and have made adjustments as needed  Cardiac Monitoring: The patient was maintained on a  cardiac monitor.  I personally viewed and interpreted the cardiac monitored which showed an underlying rhythm of: Normal sinus rhythm  Social Determinants of Health:  Factors impacting patients care include: Lack of access to primary care   Reevaluation: After the interventions noted above, I reevaluated the patient and found that they have :improved  Co morbidities that complicate the patient evaluation History reviewed. No pertinent past medical history.      Final Clinical Impression(s) / ED Diagnoses Final diagnoses:  Malaria     @PCDICTATION @    Mannie Pac T, DO 02/27/24 1646

## 2024-02-27 NOTE — Progress Notes (Signed)
 Pt with malaria who presented to ED at [facility]. Consult for artesunate 2.4mg /kg x3 doses. Will send doses from [facility] pharmacy for administration.   Sergio Batch, PharmD, BCIDP, AAHIVP, CPP Infectious Disease Pharmacist 02/27/2024 5:08 PM

## 2024-02-27 NOTE — Progress Notes (Signed)
 ID PROGRESS NOTE  33yo M who is orginally from [place], returns last week from a 2 month trip staying with family and friends, [place] mainly. Does not report taking anti-malarial medication while at home. He reports having high fevers, cough, myalgias, arthralgias. He is found to have jaundice, aki, mild transaminitis. Aki cr 1.25, ast 53, alt 111, total bili of 4.2, tbili 2.0. LA 2.7. hgb 13.8, plt 149. Peripheral blood smears has evidence of intracellular ring forms concerning for p.falciparum, can easily be seen, unclear % parasitemia  A/P: Will start artesunate for concern for severe malaria Continue with ivf  Repeat blood smear tomorrow Can keep ceftriaxone for 1 more day to see if anything returns from blood cx drawn on admit. Recommend hiv, hep b/hep c testing  Communicated treatment plan with ED providers and pharmacy team  Will see formally tomorrow

## 2024-02-27 NOTE — ED Triage Notes (Signed)
 BIB family from home for 2 weeks of sob, cough fever, and shaking. Denies smoking, or respiratory hx. Onset 2 weeks ago. Gradually progressively worse. Describes cough as productive yellow and red. Speaking in short phrases.

## 2024-02-28 LAB — BASIC METABOLIC PANEL WITH GFR
Anion gap: 9 (ref 5–15)
BUN: 10 mg/dL (ref 6–20)
CO2: 21 mmol/L — ABNORMAL LOW (ref 22–32)
Calcium: 8.1 mg/dL — ABNORMAL LOW (ref 8.9–10.3)
Chloride: 105 mmol/L (ref 98–111)
Creatinine, Ser: 0.79 mg/dL (ref 0.61–1.24)
GFR, Estimated: >60 mL/min (ref >60)
Glucose, Bld: 101 mg/dL — ABNORMAL HIGH (ref 70–99)
Potassium: 3.5 mmol/L (ref 3.5–5.1)
Sodium: 135 mmol/L (ref 135–145)

## 2024-02-28 LAB — HEPATIC FUNCTION PANEL
ALT: 160 U/L — ABNORMAL HIGH (ref 0–44)
AST: 151 U/L — ABNORMAL HIGH (ref 15–41)
Albumin: 3.1 g/dL — ABNORMAL LOW (ref 3.5–5.0)
Alkaline Phosphatase: 194 U/L — ABNORMAL HIGH (ref 38–126)
Bilirubin, Direct: 1 mg/dL — ABNORMAL HIGH (ref 0.0–0.2)
Indirect Bilirubin: 0.7 mg/dL (ref 0.3–0.9)
Total Bilirubin: 1.7 mg/dL — ABNORMAL HIGH (ref 0.0–1.2)
Total Protein: 6.9 g/dL (ref 6.5–8.1)

## 2024-02-28 LAB — CBC
HCT: 36.2 % — ABNORMAL LOW (ref 39.0–52.0)
Hemoglobin: 12 g/dL — ABNORMAL LOW (ref 13.0–17.0)
MCH: 27.1 pg (ref 26.0–34.0)
MCHC: 33.1 g/dL (ref 30.0–36.0)
MCV: 81.7 fL (ref 80.0–100.0)
Platelets: 106 K/uL — ABNORMAL LOW (ref 150–400)
RBC: 4.43 MIL/uL (ref 4.22–5.81)
RDW: 15.1 % (ref 11.5–15.5)
WBC: 6.7 K/uL (ref 4.0–10.5)
nRBC: 0 % (ref 0.0–0.2)

## 2024-02-28 LAB — HIV ANTIBODY (ROUTINE TESTING W REFLEX): HIV Screen 4th Generation wRfx: NONREACTIVE

## 2024-02-28 MED ORDER — ACETAMINOPHEN 325 MG PO TABS
650.0000 mg | ORAL_TABLET | ORAL | Status: DC | PRN
  Administered 2024-02-28 – 2024-02-29 (×2): 650 mg via ORAL
  Filled 2024-02-28 (×2): qty 2

## 2024-02-28 MED ORDER — ATOVAQUONE-PROGUANIL HCL 250-100 MG PO TABS
1.0000 | ORAL_TABLET | Freq: Every day | ORAL | Status: DC
  Filled 2024-02-28 (×2): qty 1

## 2024-02-28 MED ORDER — MELATONIN 5 MG PO TABS
5.0000 mg | ORAL_TABLET | Freq: Every evening | ORAL | Status: DC | PRN
Start: 2024-02-28 — End: 2024-03-01

## 2024-02-28 MED ORDER — GUAIFENESIN-DM 100-10 MG/5ML PO SYRP
5.0000 mL | ORAL_SOLUTION | ORAL | Status: DC | PRN
  Administered 2024-02-28 – 2024-02-29 (×3): 5 mL via ORAL
  Filled 2024-02-28 (×3): qty 5

## 2024-02-28 MED ORDER — INFLUENZA VIRUS VACC SPLIT PF (FLUZONE) 0.5 ML IM SUSY
0.5000 mL | PREFILLED_SYRINGE | INTRAMUSCULAR | Status: AC
  Administered 2024-03-01: 0.5 mL via INTRAMUSCULAR
  Filled 2024-02-28: qty 0.5

## 2024-02-28 MED ORDER — KETOROLAC TROMETHAMINE 15 MG/ML IJ SOLN: 30.0000 mg | Freq: Four times a day (QID) | INTRAMUSCULAR | Status: DC | PRN

## 2024-02-28 MED ORDER — MORPHINE SULFATE (PF) 2 MG/ML IV SOLN
2.0000 mg | Freq: Four times a day (QID) | INTRAVENOUS | Status: DC | PRN
  Administered 2024-02-28: 2 mg via INTRAVENOUS
  Filled 2024-02-28: qty 1

## 2024-02-28 MED ORDER — METOCLOPRAMIDE HCL 5 MG/ML IJ SOLN: 10.0000 mg | Freq: Four times a day (QID) | INTRAMUSCULAR | Status: DC | PRN

## 2024-02-28 MED ORDER — DIPHENHYDRAMINE HCL 50 MG/ML IJ SOLN: 25.0000 mg | Freq: Four times a day (QID) | INTRAMUSCULAR | Status: DC | PRN

## 2024-02-28 NOTE — Progress Notes (Signed)
 Progress Note    Sean Weiss   FMW:[id number]  DOB: 1990-08-09  DOA: 02/27/2024     0 PCP: Patient, No Pcp Per  Initial CC: lethargy, jaundice  Hospital Course: Sean Weiss is a 33 y.o. male without significant past medical history who presented with intermittent fever, myalgia, and jaundice after a 53-month stay in the [place] diagnosed with suspected malaria.   States that he went to visit the [place] for the first time in a decade, since he moved here with his family.  On his return trip a couple of weeks ago, he started to feel bad, with generalized malaise, some abdominal discomfort, and intermittent fevers.   Workup in the ER shows some mild renal insufficiency, bilirubinemia and abnormal LFTs.  Peripheral smear was reviewed by ID, and is concerning for malaria.  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 He was admitted for ID evaluation and treatment.  Interval History:  Resting in bed this morning.  Still has ongoing lethargy but feeling a little bit better since starting treatment.  Dad is present bedside this morning as well.  Assessment and Plan: * Malaria - Recent trip to the [place] and upon returning had symptoms of fever, lethargy, malaise, abdominal pain.  No reports of bleeding -Blood parasite exam screen positive for Plasmodium.  Sent to reference lab for further testing -Peripheral smear also evaluated by ID - Artesunate started on admission per ID - Blood cultures also sent, started on Rocephin on admission as well. -Mild anemia noted on workup. -Hepatitis panel negative.  Total bili 4.2 on admission.   Antimicrobials: Rocephin 02/27/2024 >> current  DVT prophylaxis:  enoxaparin (LOVENOX) injection 40 mg Start: 02/27/24 2200   Code Status:   Code Status: Full Code  Mobility Assessment (Last 72 Hours)     Mobility Assessment     Row Name 02/28/24 0807 02/28/24 0212         Does the patient have exclusion criteria? No -  Perform mobility assessment No - Perform mobility assessment      What is the highest level of mobility based on the mobility assessment? Level 4 (Ambulates with assistance) - Balance while stepping forward/back - Complete Level 4 (Ambulates with assistance) - Balance while stepping forward/back - Complete         Diet: Diet Orders (From admission, onward)     Start     Ordered   02/27/24 1701  Diet regular Room service appropriate? Yes; Fluid consistency: Thin  Diet effective now       Question Answer Comment  Room service appropriate? Yes   Fluid consistency: Thin      02/27/24 1700            Barriers to discharge: None Disposition Plan: Home HH orders placed: N/A Status is: Observation  Objective: Blood pressure 127/79, pulse 93, temperature 98.4 F (36.9 C), temperature source Oral, resp. rate 16, weight 102.1 kg, SpO2 99%.  Examination:  Physical Exam Constitutional:      General: He is not in acute distress.    Appearance: Normal appearance.  HENT:     Head: Normocephalic and atraumatic.     Mouth/Throat:     Mouth: Mucous membranes are moist.  Eyes:     General: Scleral icterus present.     Extraocular Movements: Extraocular movements intact.  Cardiovascular:     Rate and Rhythm: Normal rate and regular rhythm.  Pulmonary:     Effort: Pulmonary effort is normal. No respiratory  distress.     Breath sounds: Normal breath sounds. No wheezing.  Abdominal:     General: Bowel sounds are normal. There is no distension.     Palpations: Abdomen is soft.     Tenderness: There is no abdominal tenderness.  Musculoskeletal:        General: Normal range of motion.     Cervical back: Normal range of motion and neck supple.  Skin:    General: Skin is warm and dry.  Neurological:     General: No focal deficit present.     Mental Status: He is alert.  Psychiatric:        Mood and Affect: Mood normal.        Behavior: Behavior normal.      Consultants:   ID  Procedures:    Data Reviewed: Results for orders placed or performed during the hospital encounter of 02/27/24 (from the past 24 hours)  Blood culture (routine x 2)     Status: None (Preliminary result)   Collection Time: 02/27/24  1:40 PM   Specimen: BLOOD  Result Value Ref Range   Specimen Description      BLOOD LEFT ANTECUBITAL Performed at [facility], [address] [place], [place] [address]    Special Requests      BOTTLES DRAWN AEROBIC AND ANAEROBIC Blood Culture adequate volume Performed at [facility], [address], [place], [place] [address]    Culture      NO GROWTH < 24 HOURS Performed at [facility], [address] [place], [place] [address]    Report Status PENDING   Blood culture (routine x 2)     Status: None (Preliminary result)   Collection Time: 02/27/24  1:41 PM   Specimen: BLOOD  Result Value Ref Range   Specimen Description      BLOOD RIGHT ANTECUBITAL Performed at [facility], [address], [place], [place] [address]    Special Requests      BOTTLES DRAWN AEROBIC AND ANAEROBIC Blood Culture adequate volume Performed at [facility], [address] [place], [place] [address]    Culture      NO GROWTH < 24 HOURS Performed at [facility], [address] [place], [place] [address]    Report Status PENDING   Resp panel by RT-PCR (RSV, Flu A&B, Covid) Anterior Nasal Swab     Status: None   Collection Time: 02/27/24  1:51 PM   Specimen: Anterior Nasal Swab  Result Value Ref Range   SARS Coronavirus 2 by RT PCR NEGATIVE NEGATIVE   Influenza A by PCR NEGATIVE NEGATIVE   Influenza B by PCR NEGATIVE NEGATIVE   Resp Syncytial Virus by PCR NEGATIVE NEGATIVE  Comprehensive metabolic panel     Status: Abnormal   Collection Time: 02/27/24  1:51 PM  Result Value Ref Range   Sodium 133 (L) 135 - 145 mmol/L   Potassium 3.5 3.5 - 5.1 mmol/L   Chloride  101 98 - 111 mmol/L   CO2 20 (L) 22 - 32 mmol/L   Glucose, Bld 127 (H) 70 - 99 mg/dL   BUN 15 6 - 20 mg/dL   Creatinine, Ser 8.74 (H) 0.61 - 1.24 mg/dL   Calcium 9.2 8.9 - 89.6 mg/dL   Total Protein 7.9 6.5 - 8.1 g/dL   Albumin 3.6 3.5 - 5.0 g/dL   AST 53 (H) 15 - 41 U/L   ALT 111 (  H) 0 - 44 U/L   Alkaline Phosphatase 184 (H) 38 - 126 U/L   Total Bilirubin 4.2 (H) 0.0 - 1.2 mg/dL   GFR, Estimated >39 >39 mL/min   Anion gap 13 5 - 15  CBC with Differential     Status: Abnormal   Collection Time: 02/27/24  1:51 PM  Result Value Ref Range   WBC 6.7 4.0 - 10.5 K/uL   RBC 5.23 4.22 - 5.81 MIL/uL   Hemoglobin 13.8 13.0 - 17.0 g/dL   HCT 57.7 60.9 - 47.9 %   MCV 80.7 80.0 - 100.0 fL   MCH 26.4 26.0 - 34.0 pg   MCHC 32.7 30.0 - 36.0 g/dL   RDW 85.1 88.4 - 84.4 %   Platelets 149 (L) 150 - 400 K/uL   nRBC 0.0 0.0 - 0.2 %   Neutrophils Relative % 80 %   Neutro Abs 5.4 1.7 - 7.7 K/uL   Lymphocytes Relative 11 %   Lymphs Abs 0.7 0.7 - 4.0 K/uL   Monocytes Relative 8 %   Monocytes Absolute 0.5 0.1 - 1.0 K/uL   Eosinophils Relative 0 %   Eosinophils Absolute 0.0 0.0 - 0.5 K/uL   Basophils Relative 0 %   Basophils Absolute 0.0 0.0 - 0.1 K/uL   Immature Granulocytes 1 %   Abs Immature Granulocytes 0.06 0.00 - 0.07 K/uL  I-Stat CG4 Lactic Acid     Status: Abnormal   Collection Time: 02/27/24  1:51 PM  Result Value Ref Range   Lactic Acid, Venous 2.7 (HH) 0.5 - 1.9 mmol/L   Comment NOTIFIED PHYSICIAN   Parasite Exam Screen, Blood-w conf to [facility] (Not @ [facility])     Status: None   Collection Time: 02/27/24  1:51 PM  Result Value Ref Range   Parasite Exam Screen, Blood      Plasmodium or other Blood Parasites seen on thin smears. Sent to Reference Laboratory for identification and speciation.  Hepatitis panel, acute     Status: None   Collection Time: 02/27/24  1:51 PM  Result Value Ref Range   Hepatitis B Surface Ag NON REACTIVE NON REACTIVE   HCV Ab NON REACTIVE NON REACTIVE   Hep A  IgM NON REACTIVE NON REACTIVE   Hep B C IgM NON REACTIVE NON REACTIVE  Acetaminophen  level     Status: Abnormal   Collection Time: 02/27/24  1:51 PM  Result Value Ref Range   Acetaminophen  (Tylenol ), Serum <10 (L) 10 - 30 ug/mL  Bilirubin, direct     Status: Abnormal   Collection Time: 02/27/24  1:51 PM  Result Value Ref Range   Bilirubin, Direct 2.0 (H) 0.0 - 0.2 mg/dL  I-Stat CG4 Lactic Acid     Status: None   Collection Time: 02/27/24  4:10 PM  Result Value Ref Range   Lactic Acid, Venous 1.9 0.5 - 1.9 mmol/L  HIV Antibody (routine testing w rflx)     Status: None   Collection Time: 02/28/24  5:43 AM  Result Value Ref Range   HIV Screen 4th Generation wRfx Non Reactive Non Reactive  Basic metabolic panel     Status: Abnormal   Collection Time: 02/28/24  5:43 AM  Result Value Ref Range   Sodium 135 135 - 145 mmol/L   Potassium 3.5 3.5 - 5.1 mmol/L   Chloride 105 98 - 111 mmol/L   CO2 21 (L) 22 - 32 mmol/L   Glucose, Bld 101 (H) 70 - 99 mg/dL  BUN 10 6 - 20 mg/dL   Creatinine, Ser 9.20 0.61 - 1.24 mg/dL   Calcium 8.1 (L) 8.9 - 10.3 mg/dL   GFR, Estimated >39 >39 mL/min   Anion gap 9 5 - 15  CBC     Status: Abnormal   Collection Time: 02/28/24  5:43 AM  Result Value Ref Range   WBC 6.7 4.0 - 10.5 K/uL   RBC 4.43 4.22 - 5.81 MIL/uL   Hemoglobin 12.0 (L) 13.0 - 17.0 g/dL   HCT 63.7 (L) 60.9 - 47.9 %   MCV 81.7 80.0 - 100.0 fL   MCH 27.1 26.0 - 34.0 pg   MCHC 33.1 30.0 - 36.0 g/dL   RDW 84.8 88.4 - 84.4 %   Platelets 106 (L) 150 - 400 K/uL   nRBC 0.0 0.0 - 0.2 %    I have reviewed pertinent nursing notes, vitals, labs, and images as necessary. I have ordered labwork to follow up on as indicated.  I have reviewed the last notes from staff over past 24 hours. I have discussed patient's care plan and test results with nursing staff, CM/SW, and other staff as appropriate.  Old records reviewed in assessment of this patient  Time spent: Greater than 50% of the 55 minute  visit was spent in counseling/coordination of care for the patient as laid out in the A&P.   LOS: 0 days   Alm Apo, MD [facility] 02/28/2024, 11:54 AM

## 2024-02-28 NOTE — Assessment & Plan Note (Signed)
-   Recent trip to the [place] and upon returning had symptoms of fever, lethargy, malaise, abdominal pain.  No reports of bleeding -Blood parasite exam screen positive for Plasmodium.  Sent to reference lab for further testing -Peripheral smear also evaluated by ID - Artesunate started on admission per ID - Transitioned to [place] to complete course per ID -Empirically treated with Rocephin; blood cultures remained negative -Mild anemia noted on workup. -Hepatitis panel negative.  Total bili 4.2 on admission

## 2024-02-28 NOTE — Plan of Care (Signed)
  Problem: Nutrition: Goal: Adequate nutrition will be maintained Outcome: Progressing   Problem: Coping: Goal: Level of anxiety will decrease Outcome: Progressing   Problem: Elimination: Goal: Will not experience complications related to urinary retention Outcome: Progressing   Problem: Safety: Goal: Ability to remain free from injury will improve Outcome: Progressing   Problem: Skin Integrity: Goal: Risk for impaired skin integrity will decrease Outcome: Progressing   

## 2024-02-28 NOTE — Consult Note (Addendum)
 [facility]  Total days of antibiotics 2       Reason for Consult:severe malaria    Referring Physician: girguis  Principal Problem:   Malaria    HPI: Child Sean Weiss is a 33 y.o. male with no significant past med history has been back for 1 week since having a 2 month stay visiting family in [place], [place]. He reports traveling to village, as well as swimming in rivers and lakes. He didn't take any malaria prophylaxis. Upon return, he started to have fever, chills, myalgias/athralgias. He felt poorly to the point where he came to the ED on 11/8 for evaluation. He appears toxic, with conjunctivitis. His vitals were significant for his fever, and tachycardia. Labs were remarkable for LA of 2.7 transaminitis, aki cr 1.25, hgb 13.8 down to 12. Plt from 149 down to 106. His peripheral blood smear showed ring forms concerning for p.falciparum.  his hep a ig m is negative, hep b surface ag negative, hep c ab negative, and hiv negative. Blood cx NGTD. RUQ u/S non remarkable.   History reviewed. No pertinent past medical history.  Allergies: No Known Allergies   MEDICATIONS:  artesunate  2.4 mg/kg Intravenous Q12H   enoxaparin (LOVENOX) injection  40 mg Subcutaneous Q24H   [START ON 02/29/2024] influenza vac split trivalent PF  0.5 mL Intramuscular Tomorrow-1000    Social History   Tobacco Use   Smoking status: Never   Smokeless tobacco: Never  Substance Use Topics   Alcohol use: No   Drug use: No    History reviewed. No pertinent family history.  Review of Systems -  + headache, behind his eyes  OBJECTIVE: Temp:  [98.3 F (36.8 C)-102.9 F (39.4 C)] 102.9 F (39.4 C) (11/09 1945) Pulse Rate:  [86-104] 104 (11/09 1945) Resp:  [16] 16 (11/09 0134) BP: (119-144)/(77-89) 144/89 (11/09 1945) SpO2:  [99 %-100 %] 99 % (11/09 1222) Physical Exam  Constitutional: He is oriented to person, place, and time. He appears well-developed and  well-nourished. No distress.  HENT: conjunctivitis, scleral icterus Mouth/Throat: Oropharynx is clear and moist. No oropharyngeal exudate.  Cardiovascular: tachycardic, regular rhythm and normal heart sounds. Exam reveals no gallop and no friction rub.  No murmur heard.  Pulmonary/Chest: Effort normal and breath sounds normal. No respiratory distress. He has no wheezes.  Abdominal: Soft. Bowel sounds are normal. He exhibits no distension. There is no tenderness.  Lymphadenopathy:  He has no cervical adenopathy.  Neurological: He is alert and oriented to person, place, and time.  Skin: Skin is warm and dry. No rash noted. No erythema.  Psychiatric: He has a normal mood and affect. His behavior is normal.   LABS: Results for orders placed or performed during the hospital encounter of 02/27/24 (from the past 48 hours)  Blood culture (routine x 2)     Status: None (Preliminary result)   Collection Time: 02/27/24  1:40 PM   Specimen: BLOOD  Result Value Ref Range   Specimen Description      BLOOD LEFT ANTECUBITAL Performed at [facility], [address], [place], [place] [address]    Special Requests      BOTTLES DRAWN AEROBIC AND ANAEROBIC Blood Culture adequate volume Performed at [facility], [address] [place], [place] [address]    Culture      NO GROWTH < 24 HOURS Performed at [facility], [address] [place], [place] [address]  Report Status PENDING   Blood culture (routine x 2)     Status: None (Preliminary result)   Collection Time: 02/27/24  1:41 PM   Specimen: BLOOD  Result Value Ref Range   Specimen Description      BLOOD RIGHT ANTECUBITAL Performed at [facility], [address], [place], [place] [address]    Special Requests      BOTTLES DRAWN AEROBIC AND ANAEROBIC Blood Culture adequate volume Performed at [facility], [address], [place], [place] [address]     Culture      NO GROWTH < 24 HOURS Performed at [facility], [address], [place], [place] [address]    Report Status PENDING   Resp panel by RT-PCR (RSV, Flu A&B, Covid) Anterior Nasal Swab     Status: None   Collection Time: 02/27/24  1:51 PM   Specimen: Anterior Nasal Swab  Result Value Ref Range   SARS Coronavirus 2 by RT PCR NEGATIVE NEGATIVE    Comment: (NOTE) SARS-CoV-2 target nucleic acids are NOT DETECTED.  The SARS-CoV-2 RNA is generally detectable in upper respiratory specimens during the acute phase of infection. The lowest concentration of SARS-CoV-2 viral copies this assay can detect is 138 copies/mL. A negative result does not preclude SARS-Cov-2 infection and should not be used as the sole basis for treatment or other patient management decisions. A negative result may occur with  improper specimen collection/handling, submission of specimen other than nasopharyngeal swab, presence of viral mutation(s) within the areas targeted by this assay, and inadequate number of viral copies(<138 copies/mL). A negative result must be combined with clinical observations, patient history, and epidemiological information. The expected result is Negative.  Fact Sheet for Patients:  bloggercourse.com  Fact Sheet for Healthcare Providers:  seriousbroker.it  This test is no t yet approved or cleared by the [place]  FDA and  has been authorized for detection and/or diagnosis of SARS-CoV-2 by FDA under an Emergency Use Authorization (EUA). This EUA will remain  in effect (meaning this test can be used) for the duration of the COVID-19 declaration under Section 564(b)(1) of the Act, 21 U.S.C.section 360bbb-3(b)(1), unless the authorization is terminated  or revoked sooner.       Influenza A by PCR NEGATIVE NEGATIVE   Influenza B by PCR NEGATIVE NEGATIVE    Comment: (NOTE) The Xpert Xpress SARS-CoV-2/FLU/RSV plus  assay is intended as an aid in the diagnosis of influenza from Nasopharyngeal swab specimens and should not be used as a sole basis for treatment. Nasal washings and aspirates are unacceptable for Xpert Xpress SARS-CoV-2/FLU/RSV testing.  Fact Sheet for Patients: bloggercourse.com  Fact Sheet for Healthcare Providers: seriousbroker.it  This test is not yet approved or cleared by the [place]  FDA and has been authorized for detection and/or diagnosis of SARS-CoV-2 by FDA under an Emergency Use Authorization (EUA). This EUA will remain in effect (meaning this test can be used) for the duration of the COVID-19 declaration under Section 564(b)(1) of the Act, 21 U.S.C. section 360bbb-3(b)(1), unless the authorization is terminated or revoked.     Resp Syncytial Virus by PCR NEGATIVE NEGATIVE    Comment: (NOTE) Fact Sheet for Patients: bloggercourse.com  Fact Sheet for Healthcare Providers: seriousbroker.it  This test is not yet approved or cleared by the [place]  FDA and has been authorized for detection and/or diagnosis of SARS-CoV-2 by FDA under an Emergency Use Authorization (EUA). This EUA will remain in effect (meaning this test  can be used) for the duration of the COVID-19 declaration under Section 564(b)(1) of the Act, 21 U.S.C. section 360bbb-3(b)(1), unless the authorization is terminated or revoked.  Performed at [facility], [address] [place], [place] [address]   Comprehensive metabolic panel     Status: Abnormal   Collection Time: 02/27/24  1:51 PM  Result Value Ref Range   Sodium 133 (L) 135 - 145 mmol/L   Potassium 3.5 3.5 - 5.1 mmol/L   Chloride 101 98 - 111 mmol/L   CO2 20 (L) 22 - 32 mmol/L   Glucose, Bld 127 (H) 70 - 99 mg/dL    Comment: Glucose reference range applies only to samples taken after fasting for at least 8  hours.   BUN 15 6 - 20 mg/dL   Creatinine, Ser 8.74 (H) 0.61 - 1.24 mg/dL   Calcium 9.2 8.9 - 89.6 mg/dL   Total Protein 7.9 6.5 - 8.1 g/dL   Albumin 3.6 3.5 - 5.0 g/dL   AST 53 (H) 15 - 41 U/L   ALT 111 (H) 0 - 44 U/L   Alkaline Phosphatase 184 (H) 38 - 126 U/L   Total Bilirubin 4.2 (H) 0.0 - 1.2 mg/dL   GFR, Estimated >39 >39 mL/min    Comment: (NOTE) Calculated using the CKD-EPI Creatinine Equation (2021)    Anion gap 13 5 - 15    Comment: Performed at [facility], [address], [place], [place] [address]  CBC with Differential     Status: Abnormal   Collection Time: 02/27/24  1:51 PM  Result Value Ref Range   WBC 6.7 4.0 - 10.5 K/uL   RBC 5.23 4.22 - 5.81 MIL/uL   Hemoglobin 13.8 13.0 - 17.0 g/dL   HCT 57.7 60.9 - 47.9 %   MCV 80.7 80.0 - 100.0 fL   MCH 26.4 26.0 - 34.0 pg   MCHC 32.7 30.0 - 36.0 g/dL   RDW 85.1 88.4 - 84.4 %   Platelets 149 (L) 150 - 400 K/uL   nRBC 0.0 0.0 - 0.2 %   Neutrophils Relative % 80 %   Neutro Abs 5.4 1.7 - 7.7 K/uL   Lymphocytes Relative 11 %   Lymphs Abs 0.7 0.7 - 4.0 K/uL   Monocytes Relative 8 %   Monocytes Absolute 0.5 0.1 - 1.0 K/uL   Eosinophils Relative 0 %   Eosinophils Absolute 0.0 0.0 - 0.5 K/uL   Basophils Relative 0 %   Basophils Absolute 0.0 0.0 - 0.1 K/uL   Immature Granulocytes 1 %   Abs Immature Granulocytes 0.06 0.00 - 0.07 K/uL    Comment: Performed at [facility], [address] [place], [place] [address]  I-Stat CG4 Lactic Acid     Status: Abnormal   Collection Time: 02/27/24  1:51 PM  Result Value Ref Range   Lactic Acid, Venous 2.7 (HH) 0.5 - 1.9 mmol/L   Comment NOTIFIED PHYSICIAN   Parasite Exam Screen, Blood-w conf to [facility] (Not @ [facility])     Status: None   Collection Time: 02/27/24  1:51 PM  Result Value Ref Range   Parasite Exam Screen, Blood      Plasmodium or other Blood Parasites seen on thin smears. Sent to Reference Laboratory for identification and  speciation.    Comment: CRITICAL RESULT CALLED TO, READ BACK BY AND VERIFIED WITH: BLAIR ILIS RN @1658  02/27/2024 [email or web address].P Performed at [facility], [address] [place], [place]  [address] CORRECTED ON 11/08 AT 1659: PREVIOUSLY REPORTED AS Plasmodium or other Blood Parasites seen on thin smears. Sent to [facility] for identification and speciation. CRITICAL RESULT CALLED TO, READ BACK BY AND VERIFIED WITH: BLAIR ILIS RN @1658   02/27/2024   Hepatitis panel, acute     Status: None   Collection Time: 02/27/24  1:51 PM  Result Value Ref Range   Hepatitis B Surface Ag NON REACTIVE NON REACTIVE   HCV Ab NON REACTIVE NON REACTIVE    Comment: (NOTE) Nonreactive HCV antibody screen is consistent with no HCV infections,  unless recent infection is suspected or other evidence exists to indicate HCV infection.     Hep A IgM NON REACTIVE NON REACTIVE   Hep B C IgM NON REACTIVE NON REACTIVE    Comment: Performed at [facility], [address], [place], [place] [address]  Acetaminophen  level     Status: Abnormal   Collection Time: 02/27/24  1:51 PM  Result Value Ref Range   Acetaminophen  (Tylenol ), Serum <10 (L) 10 - 30 ug/mL    Comment: (NOTE) Toxic concentrations can be more effectively related to post dose interval; >200, >100, and >50 ug/mL serum concentrations correspond to toxic concentrations at 4, 8, and 12 hours post dose, respectively.  Performed at [facility], [address], [place], [place] [address]   Bilirubin, direct     Status: Abnormal   Collection Time: 02/27/24  1:51 PM  Result Value Ref Range   Bilirubin, Direct 2.0 (H) 0.0 - 0.2 mg/dL    Comment: Performed at [facility], [address] [place], [place] [address]  I-Stat CG4 Lactic Acid     Status: None   Collection Time: 02/27/24  4:10 PM  Result Value Ref Range   Lactic Acid, Venous 1.9 0.5 - 1.9 mmol/L  HIV Antibody (routine testing w rflx)      Status: None   Collection Time: 02/28/24  5:43 AM  Result Value Ref Range   HIV Screen 4th Generation wRfx Non Reactive Non Reactive    Comment: Performed at [facility], [address] [place], [place] [address]  Basic metabolic panel     Status: Abnormal   Collection Time: 02/28/24  5:43 AM  Result Value Ref Range   Sodium 135 135 - 145 mmol/L   Potassium 3.5 3.5 - 5.1 mmol/L   Chloride 105 98 - 111 mmol/L   CO2 21 (L) 22 - 32 mmol/L   Glucose, Bld 101 (H) 70 - 99 mg/dL    Comment: Glucose reference range applies only to samples taken after fasting for at least 8 hours.   BUN 10 6 - 20 mg/dL   Creatinine, Ser 9.20 0.61 - 1.24 mg/dL   Calcium 8.1 (L) 8.9 - 10.3 mg/dL   GFR, Estimated >39 >39 mL/min    Comment: (NOTE) Calculated using the CKD-EPI Creatinine Equation (2021)    Anion gap 9 5 - 15    Comment: Performed at [facility], [address] [place], [place] [address]  CBC     Status: Abnormal   Collection Time: 02/28/24  5:43 AM  Result Value Ref Range   WBC 6.7 4.0 - 10.5 K/uL   RBC 4.43 4.22 - 5.81 MIL/uL   Hemoglobin 12.0 (L) 13.0 - 17.0 g/dL   HCT 63.7 (L) 60.9 - 47.9 %   MCV 81.7 80.0 - 100.0 fL   MCH 27.1 26.0 - 34.0 pg  MCHC 33.1 30.0 - 36.0 g/dL   RDW 84.8 88.4 - 84.4 %   Platelets 106 (L) 150 - 400 K/uL   nRBC 0.0 0.0 - 0.2 %    Comment: Performed at [facility], [address], [place], [place] [address]  Hepatic function panel     Status: Abnormal   Collection Time: 02/28/24  6:21 PM  Result Value Ref Range   Total Protein 6.9 6.5 - 8.1 g/dL   Albumin 3.1 (L) 3.5 - 5.0 g/dL   AST 848 (H) 15 - 41 U/L   ALT 160 (H) 0 - 44 U/L   Alkaline Phosphatase 194 (H) 38 - 126 U/L   Total Bilirubin 1.7 (H) 0.0 - 1.2 mg/dL   Bilirubin, Direct 1.0 (H) 0.0 - 0.2 mg/dL   Indirect Bilirubin 0.7 0.3 - 0.9 mg/dL    Comment: Performed at [facility], [address], [place], [place] [address]     MICRO: reviewed IMAGING: US  Abdomen Limited RUQ (LIVER/GB) Result Date: 02/27/2024 EXAM: Right Upper Quadrant Abdominal Ultrasound 02/27/2024 02:19:22 PM TECHNIQUE: Real-time ultrasonography of the right upper quadrant of the abdomen was performed. COMPARISON: None available. CLINICAL HISTORY: Abdominal pain. FINDINGS: LIVER: The liver demonstrates normal echogenicity. No intrahepatic biliary ductal dilatation. No evidence of mass. Patent portal vein with antegrade flow. BILIARY SYSTEM: No pericholecystic fluid or wall thickening. No cholelithiasis. Negative sonographic Murphy's sign. Common bile duct is within normal limits measuring 2 mm. OTHER: No right upper quadrant ascites. IMPRESSION: 1. No acute findings. Electronically signed by: Norman Gatlin MD 02/27/2024 02:48 PM EST RP Workstation: [id number]   DG Chest Portable 1 View Result Date: 02/27/2024 CLINICAL DATA:  Shortness of breath, cough, and fever. EXAM: PORTABLE CHEST 1 VIEW COMPARISON:  None Available. FINDINGS: The heart size and mediastinal contours are within normal limits. Lung volumes are low with mild atelectasis at the lung bases. No effusion or pneumothorax is seen. No acute osseous abnormality. IMPRESSION: Low lung volumes with atelectasis at the lung bases. Electronically Signed   By: Leita Birmingham M.D.   On: 02/27/2024 14:10    Assessment/Plan:  33yo M returned traveller with fever, chills, myalgias, new anemia,thrombocytopenia in the setting of +p.falciparum, probable. + blood smear - recheck blood smear tomorrow to see that malaria percentage improving - finish off prophylaxis to kill dormant forms. With malarone 1 tab daily x 7 days  Headache = continue with supportive care. Concern that he may have dual infection, with dengue fever as well. We will send off for dengue serology tomorrow --  Transaminitis= continue to trend out to see if he is improving.  Thrombocytopenia = continue to monitor cbc. Supportive  care  Fever = avoid NSAIDs, use acetominophen instead  Discussed plan with dr patsy and pharmacy  evaluation of this patient requires complex antimicrobial therapy evaluation and counseling and isolation needs for disease transmission risk assessment and mitigation.

## 2024-02-28 NOTE — Progress Notes (Signed)
 Pharmacy Antibiotic Note  Sean Weiss is a 33 y.o. male admitted on 02/27/2024 with malaria.  Pharmacy has been consulted for Malarone dosing.  Plan: Atovaquone and proguanil 250/100mg  PO daily x7 days  Weight: 102.1 kg (225 lb)  Temp (24hrs), Avg:99.5 F (37.5 C), Min:98.3 F (36.8 C), Max:102.9 F (39.4 C)  Recent Labs  Lab 02/27/24 1351 02/27/24 1610 02/28/24 0543  WBC 6.7  --  6.7  CREATININE 1.25*  --  0.79  LATICACIDVEN 2.7* 1.9  --     CrCl cannot be calculated (Unknown ideal weight.).    No Known Allergies  Antimicrobials this admission: 11/8 Artesunate x3 doses 11/10 Malarone >> 11/16  Thank you for allowing pharmacy to be a part of this patient's care.  Wanda Hasting PharmD, BCPS [facility] main pharmacy [phone] 02/28/2024 8:20 PM

## 2024-02-28 NOTE — Hospital Course (Signed)
 Sean Weiss is a 33 y.o. male without significant past medical history who presented with intermittent fever, myalgia, and jaundice after a 43-month stay in the [place] diagnosed with suspected malaria.   States that he went to visit the [place] for the first time in a decade, since he moved here with his family.  On his return trip a couple of weeks ago, he started to feel bad, with generalized malaise, some abdominal discomfort, and intermittent fevers.   Workup in the ER shows some mild renal insufficiency, bilirubinemia and abnormal LFTs.  Peripheral smear was reviewed by ID, and is concerning for malaria.  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 He was admitted for ID evaluation and treatment.

## 2024-02-29 ENCOUNTER — Other Ambulatory Visit (HOSPITAL_COMMUNITY): Payer: Self-pay

## 2024-02-29 DIAGNOSIS — D696 Thrombocytopenia, unspecified: Secondary | ICD-10-CM

## 2024-02-29 DIAGNOSIS — R7401 Elevation of levels of liver transaminase levels: Secondary | ICD-10-CM

## 2024-02-29 LAB — COMPREHENSIVE METABOLIC PANEL WITH GFR
ALT: 152 U/L — ABNORMAL HIGH (ref 0–44)
AST: 91 U/L — ABNORMAL HIGH (ref 15–41)
Albumin: 3 g/dL — ABNORMAL LOW (ref 3.5–5.0)
Alkaline Phosphatase: 190 U/L — ABNORMAL HIGH (ref 38–126)
Anion gap: 9 (ref 5–15)
BUN: 7 mg/dL (ref 6–20)
CO2: 22 mmol/L (ref 22–32)
Calcium: 8.5 mg/dL — ABNORMAL LOW (ref 8.9–10.3)
Chloride: 104 mmol/L (ref 98–111)
Creatinine, Ser: 0.79 mg/dL (ref 0.61–1.24)
GFR, Estimated: >60 mL/min (ref >60)
Glucose, Bld: 85 mg/dL (ref 70–99)
Potassium: 3.7 mmol/L (ref 3.5–5.1)
Sodium: 135 mmol/L (ref 135–145)
Total Bilirubin: 1.2 mg/dL (ref 0.0–1.2)
Total Protein: 6.8 g/dL (ref 6.5–8.1)

## 2024-02-29 LAB — CBC WITH DIFFERENTIAL/PLATELET
Abs Immature Granulocytes: 0.13 K/uL — ABNORMAL HIGH (ref 0.00–0.07)
Basophils Absolute: 0 K/uL (ref 0.0–0.1)
Basophils Relative: 1 %
Eosinophils Absolute: 0.3 K/uL (ref 0.0–0.5)
Eosinophils Relative: 4 %
HCT: 35.2 % — ABNORMAL LOW (ref 39.0–52.0)
Hemoglobin: 11.5 g/dL — ABNORMAL LOW (ref 13.0–17.0)
Immature Granulocytes: 2 %
Lymphocytes Relative: 45 %
Lymphs Abs: 3 K/uL (ref 0.7–4.0)
MCH: 26.6 pg (ref 26.0–34.0)
MCHC: 32.7 g/dL (ref 30.0–36.0)
MCV: 81.3 fL (ref 80.0–100.0)
Monocytes Absolute: 1.1 K/uL — ABNORMAL HIGH (ref 0.1–1.0)
Monocytes Relative: 16 %
Neutro Abs: 2.1 K/uL (ref 1.7–7.7)
Neutrophils Relative %: 32 %
Platelets: 120 K/uL — ABNORMAL LOW (ref 150–400)
RBC: 4.33 MIL/uL (ref 4.22–5.81)
RDW: 14.9 % (ref 11.5–15.5)
WBC: 6.6 K/uL (ref 4.0–10.5)
nRBC: 0.3 % — ABNORMAL HIGH (ref 0.0–0.2)

## 2024-02-29 LAB — MAGNESIUM: Magnesium: 2.1 mg/dL (ref 1.7–2.4)

## 2024-02-29 LAB — PATHOLOGIST SMEAR REVIEW

## 2024-02-29 LAB — PARASITE EXAM SCREEN, BLOOD-W CONF TO LABCORP (NOT @ ARMC)

## 2024-02-29 MED ORDER — ARTEMETHER-LUMEFANTRINE 20-120 MG PO TABS
4.0000 | ORAL_TABLET | Freq: Three times a day (TID) | ORAL | Status: AC
  Administered 2024-02-29 (×2): 4 via ORAL
  Filled 2024-02-29 (×2): qty 24

## 2024-02-29 MED ORDER — ARTEMETHER-LUMEFANTRINE 20-120 MG PO TABS
4.0000 | ORAL_TABLET | Freq: Two times a day (BID) | ORAL | Status: DC
  Administered 2024-03-01: 4 via ORAL
  Filled 2024-02-29: qty 24

## 2024-02-29 NOTE — Progress Notes (Signed)
 Progress Note    Sean Weiss   FMW:[id number]  DOB: [birth year]  DOA: 02/27/2024     1 PCP: Patient, No Pcp Per  Initial CC: lethargy, jaundice  Hospital Course: Sean Weiss is a 33 y.o. male without significant past medical history who presented with intermittent fever, myalgia, and jaundice after a 71-month stay in the [place] diagnosed with suspected malaria.   States that he went to visit the [place] for the first time in a decade, since he moved here with his family.  On his return trip a couple of weeks ago, he started to feel bad, with generalized malaise, some abdominal discomfort, and intermittent fevers.   Workup in the ER shows some mild renal insufficiency, bilirubinemia and abnormal LFTs.  Peripheral smear was reviewed by ID, and is concerning for malaria.  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 He was admitted for ID evaluation and treatment.  Interval History:  Resting in bed this morning.  HA improved and no complaints today. Ready for home he says when able.   Assessment and Plan: * Malaria - Recent trip to the [place] and upon returning had symptoms of fever, lethargy, malaise, abdominal pain.  No reports of bleeding -Blood parasite exam screen positive for Plasmodium.  Sent to reference lab for further testing -Peripheral smear also evaluated by ID - Artesunate started on admission per ID - Blood cultures also sent, started on Rocephin on admission as well. -Mild anemia noted on workup. -Hepatitis panel negative.  Total bili 4.2 on admission. - treatment plan as per ID   Antimicrobials: Rocephin 02/27/2024 >> current  DVT prophylaxis:  enoxaparin (LOVENOX) injection 40 mg Start: 02/27/24 2200   Code Status:   Code Status: Full Code  Mobility Assessment (Last 72 Hours)     Mobility Assessment     Row Name 02/29/24 1102 02/28/24 2000 02/28/24 0807 02/28/24 0212     Does the patient have exclusion criteria? No - Perform  mobility assessment No - Perform mobility assessment No - Perform mobility assessment No - Perform mobility assessment    What is the highest level of mobility based on the mobility assessment? Level 4 (Ambulates with assistance) - Balance while stepping forward/back - Complete Level 4 (Ambulates with assistance) - Balance while stepping forward/back - Complete Level 4 (Ambulates with assistance) - Balance while stepping forward/back - Complete Level 4 (Ambulates with assistance) - Balance while stepping forward/back - Complete       Diet: Diet Orders (From admission, onward)     Start     Ordered   02/27/24 1701  Diet regular Room service appropriate? Yes; Fluid consistency: Thin  Diet effective now       Question Answer Comment  Room service appropriate? Yes   Fluid consistency: Thin      02/27/24 1700            Barriers to discharge: None Disposition Plan: Home HH orders placed: N/A Status is: Inpt  Objective: Blood pressure 122/75, pulse 82, temperature 99.3 F (37.4 C), temperature source Oral, resp. rate 18, weight 102.1 kg, SpO2 97%.  Examination:  Physical Exam Constitutional:      General: He is not in acute distress.    Appearance: Normal appearance.  HENT:     Head: Normocephalic and atraumatic.     Mouth/Throat:     Mouth: Mucous membranes are moist.  Eyes:     General: Scleral icterus present.     Extraocular Movements: Extraocular movements  intact.  Cardiovascular:     Rate and Rhythm: Normal rate and regular rhythm.  Pulmonary:     Effort: Pulmonary effort is normal. No respiratory distress.     Breath sounds: Normal breath sounds. No wheezing.  Abdominal:     General: Bowel sounds are normal. There is no distension.     Palpations: Abdomen is soft.     Tenderness: There is no abdominal tenderness.  Musculoskeletal:        General: Normal range of motion.     Cervical back: Normal range of motion and neck supple.  Skin:    General: Skin is warm  and dry.  Neurological:     General: No focal deficit present.     Mental Status: He is alert.  Psychiatric:        Mood and Affect: Mood normal.        Behavior: Behavior normal.      Consultants:  ID  Procedures:    Data Reviewed: Results for orders placed or performed during the hospital encounter of 02/27/24 (from the past 24 hours)  Hepatic function panel     Status: Abnormal   Collection Time: 02/28/24  6:21 PM  Result Value Ref Range   Total Protein 6.9 6.5 - 8.1 g/dL   Albumin 3.1 (L) 3.5 - 5.0 g/dL   AST 848 (H) 15 - 41 U/L   ALT 160 (H) 0 - 44 U/L   Alkaline Phosphatase 194 (H) 38 - 126 U/L   Total Bilirubin 1.7 (H) 0.0 - 1.2 mg/dL   Bilirubin, Direct 1.0 (H) 0.0 - 0.2 mg/dL   Indirect Bilirubin 0.7 0.3 - 0.9 mg/dL  CBC with Differential/Platelet     Status: Abnormal   Collection Time: 02/29/24  4:34 AM  Result Value Ref Range   WBC 6.6 4.0 - 10.5 K/uL   RBC 4.33 4.22 - 5.81 MIL/uL   Hemoglobin 11.5 (L) 13.0 - 17.0 g/dL   HCT 64.7 (L) 60.9 - 47.9 %   MCV 81.3 80.0 - 100.0 fL   MCH 26.6 26.0 - 34.0 pg   MCHC 32.7 30.0 - 36.0 g/dL   RDW 85.0 88.4 - 84.4 %   Platelets 120 (L) 150 - 400 K/uL   nRBC 0.3 (H) 0.0 - 0.2 %   Neutrophils Relative % 32 %   Neutro Abs 2.1 1.7 - 7.7 K/uL   Lymphocytes Relative 45 %   Lymphs Abs 3.0 0.7 - 4.0 K/uL   Monocytes Relative 16 %   Monocytes Absolute 1.1 (H) 0.1 - 1.0 K/uL   Eosinophils Relative 4 %   Eosinophils Absolute 0.3 0.0 - 0.5 K/uL   Basophils Relative 1 %   Basophils Absolute 0.0 0.0 - 0.1 K/uL   WBC Morphology See Note    RBC Morphology MORPHOLOGY UNREMARKABLE    Smear Review See Note    Immature Granulocytes 2 %   Abs Immature Granulocytes 0.13 (H) 0.00 - 0.07 K/uL  Comprehensive metabolic panel with GFR     Status: Abnormal   Collection Time: 02/29/24  4:34 AM  Result Value Ref Range   Sodium 135 135 - 145 mmol/L   Potassium 3.7 3.5 - 5.1 mmol/L   Chloride 104 98 - 111 mmol/L   CO2 22 22 - 32 mmol/L    Glucose, Bld 85 70 - 99 mg/dL   BUN 7 6 - 20 mg/dL   Creatinine, Ser 9.20 0.61 - 1.24 mg/dL   Calcium 8.5 (L) 8.9 - 10.3  mg/dL   Total Protein 6.8 6.5 - 8.1 g/dL   Albumin 3.0 (L) 3.5 - 5.0 g/dL   AST 91 (H) 15 - 41 U/L   ALT 152 (H) 0 - 44 U/L   Alkaline Phosphatase 190 (H) 38 - 126 U/L   Total Bilirubin 1.2 0.0 - 1.2 mg/dL   GFR, Estimated >39 >39 mL/min   Anion gap 9 5 - 15  Magnesium     Status: None   Collection Time: 02/29/24  4:34 AM  Result Value Ref Range   Magnesium 2.1 1.7 - 2.4 mg/dL    I have reviewed pertinent nursing notes, vitals, labs, and images as necessary. I have ordered labwork to follow up on as indicated.  I have reviewed the last notes from staff over past 24 hours. I have discussed patient's care plan and test results with nursing staff, CM/SW, and other staff as appropriate.  Old records reviewed in assessment of this patient  Time spent: Greater than 50% of the 55 minute visit was spent in counseling/coordination of care for the patient as laid out in the A&P.   LOS: 1 day   Alm Apo, MD [facility] 02/29/2024, 2:01 PM

## 2024-02-29 NOTE — Progress Notes (Signed)
 Received phone call from [facility] lab. Lab states the malaria slides were negative, but they will be sending slides in the morning for a repeat reading and they are sending slides to [facility] as well.

## 2024-02-29 NOTE — Plan of Care (Signed)

## 2024-02-29 NOTE — Plan of Care (Signed)
   Problem: Education: Goal: Knowledge of General Education information will improve Description: Including pain rating scale, medication(s)/side effects and non-pharmacologic comfort measures Outcome: Progressing   Problem: Pain Managment: Goal: General experience of comfort will improve and/or be controlled Outcome: Progressing   Problem: Safety: Goal: Ability to remain free from injury will improve Outcome: Progressing

## 2024-02-29 NOTE — Plan of Care (Signed)
   Problem: Clinical Measurements: Goal: Diagnostic test results will improve Outcome: Progressing Goal: Respiratory complications will improve Outcome: Progressing Goal: Cardiovascular complication will be avoided Outcome: Progressing   Problem: Activity: Goal: Risk for activity intolerance will decrease Outcome: Progressing   Problem: Nutrition: Goal: Adequate nutrition will be maintained Outcome: Progressing

## 2024-02-29 NOTE — Progress Notes (Signed)
 [facility] Infectious Diseases Follow Up Note  Patient Identification: Patient Name: Sean Weiss MRN: [id number] Admit Date: 02/27/2024  1:09 PM Age: 32 y.o.Today's Date: 02/29/2024  Reason for Visit: Malaria  Principal Problem:   Malaria   Antibiotics: IV artesunate 11/8-11/9 IV ceftriaxone 11/8-  Lines/Hardwares:   Interval Events: Labs remarkable for improving liver enzymes, hemoglobin 11.5, platelets improved to 120  Assessment 33 year old male with no significant PMH who presented with intermittent fever, myalgia and jaundice after 25-month stay in [place], found to have malaria, possible P falciparum   # Malaria, 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  - Fu Plasmodium PCR 11/8 - I have ordered parasite screen for this am and 5 pm. Spoke with [facility] @ Hematology for parasite %, they are going to get reviewed by Pathology - continue IV ceftriaxone until blood cultures negative for 72 hrs and likely DC thereafter - universal/standard isolation precautions  Following   Rest of the management as per the primary team. Thank you for the consult. Please page with pertinent questions or concerns.  ______________________________________________________________________ Subjective patient seen and examined at the bedside.  Headache better, body ache and abdominal pain better.  Tolerating p.o. without any concerns.   History reviewed. No pertinent past medical history.  History reviewed. No pertinent  surgical history.  Vitals BP 118/81 (BP Location: Right Arm)   Pulse 94   Temp 100.1 F (37.8 C) (Oral)   Resp 16   Wt 102.1 kg   SpO2 98%       Physical Exam Constitutional: Male sitting in the bed, not in acute distress    Comments: HEENT WNL  Cardiovascular:     Rate and Rhythm: Normal rate     Heart sounds: S1 and S2  Pulmonary:     Effort: Pulmonary effort is normal.     Comments: Normal breath sounds  Abdominal:     Palpations: Abdomen is soft.     Tenderness: Nondistended and nontender  Musculoskeletal:        General: No swelling or tenderness in peripheral joints  Skin:    Comments: No rashes  Neurological:     General: Awake, alert and oriented, grossly nonfocal  Psychiatric:        Mood and Affect: Mood normal.   Pertinent Microbiology Results for orders placed or performed during the hospital encounter of 02/27/24  Blood culture (routine x 2)     Status: None (Preliminary result)   Collection Time: 02/27/24  1:40 PM   Specimen: BLOOD  Result Value Ref Range Status   Specimen Description   Final    BLOOD LEFT ANTECUBITAL Performed at [facility], [address] [place], [place] [address]    Special Requests   Final    BOTTLES DRAWN AEROBIC AND ANAEROBIC Blood Culture adequate volume Performed at [facility], [address], [place], [place] [address]    Culture   Final    NO GROWTH 2 DAYS Performed at [facility], [address] [place], [place] [address]  Report Status PENDING  Incomplete  Blood culture (routine x 2)     Status: None (Preliminary result)   Collection Time: 02/27/24  1:41 PM   Specimen: BLOOD  Result Value Ref Range Status   Specimen Description   Final    BLOOD RIGHT ANTECUBITAL Performed at [facility], [address], [place], [place] [address]    Special Requests   Final    BOTTLES DRAWN AEROBIC AND ANAEROBIC Blood Culture adequate  volume Performed at [facility], [address] [place], [place] [address]    Culture   Final    NO GROWTH 2 DAYS Performed at [facility], [address] [place], [place] [address]    Report Status PENDING  Incomplete  Resp panel by RT-PCR (RSV, Flu A&B, Covid) Anterior Nasal Swab     Status: None   Collection Time: 02/27/24  1:51 PM   Specimen: Anterior Nasal Swab  Result Value Ref Range Status   SARS Coronavirus 2 by RT PCR NEGATIVE NEGATIVE Final    Comment: (NOTE) SARS-CoV-2 target nucleic acids are NOT DETECTED.  The SARS-CoV-2 RNA is generally detectable in upper respiratory specimens during the acute phase of infection. The lowest concentration of SARS-CoV-2 viral copies this assay can detect is 138 copies/mL. A negative result does not preclude SARS-Cov-2 infection and should not be used as the sole basis for treatment or other patient management decisions. A negative result may occur with  improper specimen collection/handling, submission of specimen other than nasopharyngeal swab, presence of viral mutation(s) within the areas targeted by this assay, and inadequate number of viral copies(<138 copies/mL). A negative result must be combined with clinical observations, patient history, and epidemiological information. The expected result is Negative.  Fact Sheet for Patients:  bloggercourse.com  Fact Sheet for Healthcare Providers:  seriousbroker.it  This test is no t yet approved or cleared by the [place]  FDA and  has been authorized for detection and/or diagnosis of SARS-CoV-2 by FDA under an Emergency Use Authorization (EUA). This EUA will remain  in effect (meaning this test can be used) for the duration of the COVID-19 declaration under Section 564(b)(1) of the Act, 21 U.S.C.section 360bbb-3(b)(1), unless the authorization is terminated  or revoked sooner.       Influenza A by  PCR NEGATIVE NEGATIVE Final   Influenza B by PCR NEGATIVE NEGATIVE Final    Comment: (NOTE) The Xpert Xpress SARS-CoV-2/FLU/RSV plus assay is intended as an aid in the diagnosis of influenza from Nasopharyngeal swab specimens and should not be used as a sole basis for treatment. Nasal washings and aspirates are unacceptable for Xpert Xpress SARS-CoV-2/FLU/RSV testing.  Fact Sheet for Patients: bloggercourse.com  Fact Sheet for Healthcare Providers: seriousbroker.it  This test is not yet approved or cleared by the [place]  FDA and has been authorized for detection and/or diagnosis of SARS-CoV-2 by FDA under an Emergency Use Authorization (EUA). This EUA will remain in effect (meaning this test can be used) for the duration of the COVID-19 declaration under Section 564(b)(1) of the Act, 21 U.S.C. section 360bbb-3(b)(1), unless the authorization is terminated or revoked.     Resp Syncytial Virus by PCR NEGATIVE NEGATIVE Final    Comment: (NOTE) Fact Sheet for Patients: bloggercourse.com  Fact Sheet for Healthcare Providers: seriousbroker.it  This test is not yet approved or cleared by the [place]  FDA and has been authorized for detection and/or diagnosis of SARS-CoV-2 by FDA under an Emergency Use Authorization (  EUA). This EUA will remain in effect (meaning this test can be used) for the duration of the COVID-19 declaration under Section 564(b)(1) of the Act, 21 U.S.C. section 360bbb-3(b)(1), unless the authorization is terminated or revoked.  Performed at [facility], [address] [place], [place] [address]    Pertinent Lab.    Latest Ref Rng & Units 02/29/2024    4:34 AM 02/28/2024    5:43 AM 02/27/2024    1:51 PM  CBC  WBC 4.0 - 10.5 K/uL 6.6  6.7  6.7   Hemoglobin 13.0 - 17.0 g/dL 88.4  87.9  86.1   Hematocrit 39.0 - 52.0 % 35.2  36.2   42.2   Platelets 150 - 400 K/uL 120  106  149       Latest Ref Rng & Units 02/29/2024    4:34 AM 02/28/2024    6:21 PM 02/28/2024    5:43 AM  CMP  Glucose 70 - 99 mg/dL 85   898   BUN 6 - 20 mg/dL 7   10   Creatinine 9.38 - 1.24 mg/dL 9.20   9.20   Sodium 864 - 145 mmol/L 135   135   Potassium 3.5 - 5.1 mmol/L 3.7   3.5   Chloride 98 - 111 mmol/L 104   105   CO2 22 - 32 mmol/L 22   21   Calcium 8.9 - 10.3 mg/dL 8.5   8.1   Total Protein 6.5 - 8.1 g/dL 6.8  6.9    Total Bilirubin 0.0 - 1.2 mg/dL 1.2  1.7    Alkaline Phos 38 - 126 U/L 190  194    AST 15 - 41 U/L 91  151    ALT 0 - 44 U/L 152  160       Pertinent Imaging today Plain films and CT images have been personally visualized and interpreted; radiology reports have been reviewed. Decision making incorporated into the Impression /   US  Abdomen Limited RUQ (LIVER/GB) Result Date: 02/27/2024 EXAM: Right Upper Quadrant Abdominal Ultrasound 02/27/2024 02:19:22 PM TECHNIQUE: Real-time ultrasonography of the right upper quadrant of the abdomen was performed. COMPARISON: None available. CLINICAL HISTORY: Abdominal pain. FINDINGS: LIVER: The liver demonstrates normal echogenicity. No intrahepatic biliary ductal dilatation. No evidence of mass. Patent portal vein with antegrade flow. BILIARY SYSTEM: No pericholecystic fluid or wall thickening. No cholelithiasis. Negative sonographic Murphy's sign. Common bile duct is within normal limits measuring 2 mm. OTHER: No right upper quadrant ascites. IMPRESSION: 1. No acute findings. Electronically signed by: Norman Gatlin MD 02/27/2024 02:48 PM EST RP Workstation: [id number]   DG Chest Portable 1 View Result Date: 02/27/2024 CLINICAL DATA:  Shortness of breath, cough, and fever. EXAM: PORTABLE CHEST 1 VIEW COMPARISON:  None Available. FINDINGS: The heart size and mediastinal contours are within normal limits. Lung volumes are low with mild atelectasis at the lung bases. No effusion or pneumothorax  is seen. No acute osseous abnormality. IMPRESSION: Low lung volumes with atelectasis at the lung bases. Electronically Signed   By: Leita Birmingham M.D.   On: 02/27/2024 14:10    I spent 50 minutes involved in face-to-face and non-face-to-face activities for this patient on the day of the visit. Professional time spent includes the following activities: Preparing to see the patient (review of tests), Obtaining and reviewing separately obtained history (hospitalist progress note, Dr. Lynton note), Performing a medically appropriate examination and evaluation , Ordering medications/labs, referring and communicating with other health care professionals,  Documenting clinical information in the EMR, Independently interpreting results (not separately reported), Communicating results to the patient, Counseling and educating the patient and Care coordination (not separately reported).   Plan d/w requesting provider as well as ID pharm D  Of note, portions of this note may have been created with voice recognition software. While this note has been edited for accuracy, occasional wrong-word or 'sound-a-like' substitutions may have occurred due to the inherent limitations of voice recognition software.   Electronically signed by:   Annalee Orem, MD Infectious Disease Physician [facility] for Infectious Disease Pager: [phone]

## 2024-03-01 ENCOUNTER — Other Ambulatory Visit (HOSPITAL_COMMUNITY): Payer: Self-pay

## 2024-03-01 LAB — CBC WITH DIFFERENTIAL/PLATELET
Abs Immature Granulocytes: 0.1 K/uL — ABNORMAL HIGH (ref 0.00–0.07)
Basophils Absolute: 0 K/uL (ref 0.0–0.1)
Basophils Relative: 0 %
Eosinophils Absolute: 0.5 K/uL (ref 0.0–0.5)
Eosinophils Relative: 7 %
HCT: 35.1 % — ABNORMAL LOW (ref 39.0–52.0)
Hemoglobin: 11.9 g/dL — ABNORMAL LOW (ref 13.0–17.0)
Immature Granulocytes: 1 %
Lymphocytes Relative: 42 %
Lymphs Abs: 3.1 K/uL (ref 0.7–4.0)
MCH: 27.4 pg (ref 26.0–34.0)
MCHC: 33.9 g/dL (ref 30.0–36.0)
MCV: 80.7 fL (ref 80.0–100.0)
Monocytes Absolute: 1.3 K/uL — ABNORMAL HIGH (ref 0.1–1.0)
Monocytes Relative: 17 %
Neutro Abs: 2.4 K/uL (ref 1.7–7.7)
Neutrophils Relative %: 33 %
Platelets: 185 K/uL (ref 150–400)
RBC: 4.35 MIL/uL (ref 4.22–5.81)
RDW: 15.1 % (ref 11.5–15.5)
WBC: 7.4 K/uL (ref 4.0–10.5)
nRBC: 0 % (ref 0.0–0.2)

## 2024-03-01 LAB — COMPREHENSIVE METABOLIC PANEL WITH GFR
ALT: 123 U/L — ABNORMAL HIGH (ref 0–44)
AST: 48 U/L — ABNORMAL HIGH (ref 15–41)
Albumin: 3.2 g/dL — ABNORMAL LOW (ref 3.5–5.0)
Alkaline Phosphatase: 173 U/L — ABNORMAL HIGH (ref 38–126)
Anion gap: 9 (ref 5–15)
BUN: 11 mg/dL (ref 6–20)
CO2: 26 mmol/L (ref 22–32)
Calcium: 8.9 mg/dL (ref 8.9–10.3)
Chloride: 104 mmol/L (ref 98–111)
Creatinine, Ser: 0.84 mg/dL (ref 0.61–1.24)
GFR, Estimated: >60 mL/min (ref >60)
Glucose, Bld: 89 mg/dL (ref 70–99)
Potassium: 3.9 mmol/L (ref 3.5–5.1)
Sodium: 139 mmol/L (ref 135–145)
Total Bilirubin: 0.7 mg/dL (ref 0.0–1.2)
Total Protein: 7.1 g/dL (ref 6.5–8.1)

## 2024-03-01 LAB — MAGNESIUM: Magnesium: 2.4 mg/dL (ref 1.7–2.4)

## 2024-03-01 LAB — PATHOLOGIST SMEAR REVIEW

## 2024-03-01 MED ORDER — ATOVAQUONE-PROGUANIL HCL 250-100 MG PO TABS
4.0000 | ORAL_TABLET | Freq: Every day | ORAL | 0 refills | Status: AC
  Filled 2024-03-01: qty 12, 3d supply, fill #0

## 2024-03-01 NOTE — Progress Notes (Signed)
 Discharge meds in a secure bag delivered to patient in room by this RN

## 2024-03-01 NOTE — Progress Notes (Addendum)
 [facility] Infectious Diseases Follow Up Note  Patient Identification: Patient Name: Ison Yul Diana MRN: [id number] Admit Date: 02/27/2024  1:09 PM Age: 33 y.o.Today's Date: 03/01/2024  Reason for Visit: Malaria  Principal Problem:   Malaria   Antibiotics: IV artesunate 11/8-11/9 IV ceftriaxone 11/8-  Lines/Hardwares:   Interval Events: afebrile for more than 24 hrs  Labs remarkable for improving liver enzymes, hemoglobin 11.9, platelets normal Blood cx NG in 3 days   Assessment 33 year old male with no significant PMH who presented with intermittent fever, myalgia and jaundice after 59-month stay in [place], found to have malaria, 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 Malarone 4 tablets  once daily for 3 days  - fu arranged at [facility] on 11/25 @2 : 30 pm to fu final ID of plasmodium to see if tx for hypnozoites needed as well as CBC check, fu dengue serology.  - universal/standard isolation precautions  ID will so.   Rest of the management as per the primary team. Thank you for the consult. Please page with pertinent questions or concerns.  ______________________________________________________________________ Subjective patient seen and examined at the bedside.  All prior symptoms resolved. No concerns.   History reviewed. No pertinent past medical history.  History reviewed. No pertinent surgical  history.  Vitals BP 128/81 (BP Location: Right Arm)   Pulse 67   Temp 98.4 F (36.9 C) (Oral)   Resp 16   Wt 102.1 kg   SpO2 99%       Physical Exam Constitutional: Male sitting in the bed, not in acute distress    Comments: HEENT WNL  Cardiovascular:     Rate and Rhythm: Normal rate     Heart sounds:  Pulmonary:     Effort: Pulmonary effort is normal.     Comments: Normal breath sounds  Abdominal:     Palpations: Abdomen     Tenderness: Nondistended   Musculoskeletal:        General: No swelling or tenderness in peripheral joints  Skin:    Comments: No rashes  Neurological:     General: Awake, alert and oriented, grossly nonfocal  Psychiatric:        Mood and Affect: Mood normal.   Pertinent Microbiology Results for orders placed or performed during the hospital encounter of 02/27/24  Blood culture (routine x 2)     Status: None (Preliminary result)   Collection Time: 02/27/24  1:40 PM   Specimen: BLOOD  Result Value Ref Range Status   Specimen Description   Final    BLOOD LEFT ANTECUBITAL Performed at [facility], [address] [place], [place] [address]    Special Requests   Final    BOTTLES DRAWN AEROBIC AND ANAEROBIC Blood Culture adequate volume Performed at [facility], [address], [place], [place] [address]    Culture   Final    NO GROWTH 3 DAYS Performed at [facility], [address], [place], [place]  [address]    Report Status PENDING  Incomplete  Blood culture (routine x 2)     Status: None (Preliminary result)   Collection Time: 02/27/24  1:41 PM   Specimen: BLOOD  Result Value Ref Range Status   Specimen Description   Final    BLOOD RIGHT ANTECUBITAL Performed at [facility], [address] [place], [place] [address]    Special Requests   Final    BOTTLES DRAWN AEROBIC AND ANAEROBIC Blood Culture adequate volume Performed at [facility], [address] [place], [place] [address]    Culture   Final    NO GROWTH 3 DAYS Performed at [facility], [address], [place], [place] [address]    Report Status PENDING  Incomplete  Resp panel by RT-PCR (RSV, Flu A&B, Covid) Anterior Nasal Swab     Status: None   Collection Time: 02/27/24  1:51 PM   Specimen: Anterior Nasal Swab  Result Value Ref Range Status   SARS Coronavirus 2 by RT PCR NEGATIVE NEGATIVE Final    Comment: (NOTE) SARS-CoV-2 target nucleic acids are NOT DETECTED.  The SARS-CoV-2 RNA is generally detectable in upper respiratory specimens during the acute phase of infection. The lowest concentration of SARS-CoV-2 viral copies this assay can detect is 138 copies/mL. A negative result does not preclude SARS-Cov-2 infection and should not be used as the sole basis for treatment or other patient management decisions. A negative result may occur with  improper specimen collection/handling, submission of specimen other than nasopharyngeal swab, presence of viral mutation(s) within the areas targeted by this assay, and inadequate number of viral copies(<138 copies/mL). A negative result must be combined with clinical observations, patient history, and epidemiological information. The expected result is Negative.  Fact Sheet for Patients:  bloggercourse.com  Fact Sheet for Healthcare Providers:  seriousbroker.it  This test is no t yet approved or cleared by the [place]  FDA and  has been authorized for detection and/or diagnosis of SARS-CoV-2 by FDA under an Emergency Use Authorization (EUA). This EUA will remain  in effect (meaning this test can be used) for the duration of the COVID-19 declaration under Section 564(b)(1) of the Act, 21 U.S.C.section 360bbb-3(b)(1), unless the authorization is terminated  or revoked sooner.       Influenza A by PCR NEGATIVE NEGATIVE Final   Influenza B by PCR  NEGATIVE NEGATIVE Final    Comment: (NOTE) The Xpert Xpress SARS-CoV-2/FLU/RSV plus assay is intended as an aid in the diagnosis of influenza from Nasopharyngeal swab specimens and should not be used as a sole basis for treatment. Nasal washings and aspirates are unacceptable for Xpert Xpress SARS-CoV-2/FLU/RSV testing.  Fact Sheet for Patients: bloggercourse.com  Fact Sheet for Healthcare Providers: seriousbroker.it  This test is not yet approved or cleared by the [place]  FDA and has been authorized for detection and/or diagnosis of SARS-CoV-2 by FDA under an Emergency Use Authorization (EUA). This EUA will remain in effect (meaning this test can be used) for the duration of the COVID-19 declaration under Section 564(b)(1) of the Act, 21 U.S.C. section 360bbb-3(b)(1), unless the authorization is terminated or revoked.     Resp Syncytial Virus by PCR NEGATIVE NEGATIVE Final    Comment: (NOTE) Fact Sheet for Patients: bloggercourse.com  Fact Sheet for Healthcare Providers: seriousbroker.it  This test is not yet approved or cleared by the [place]  FDA and has been authorized for detection and/or diagnosis of SARS-CoV-2 by FDA under  an Emergency Use Authorization (EUA). This EUA will remain in effect (meaning this test can be used) for the duration of the COVID-19 declaration under Section 564(b)(1) of the Act, 21 U.S.C. section 360bbb-3(b)(1), unless the authorization is terminated or revoked.  Performed at [facility], [address], [place], [place] [address]    Pertinent Lab.    Latest Ref Rng & Units 03/01/2024    4:59 AM 02/29/2024    4:34 AM 02/28/2024    5:43 AM  CBC  WBC 4.0 - 10.5 K/uL 7.4  6.6  6.7   Hemoglobin 13.0 - 17.0 g/dL 88.0  88.4  87.9   Hematocrit 39.0 - 52.0 % 35.1  35.2  36.2   Platelets 150 - 400 K/uL 185  120  106        Latest Ref Rng & Units 03/01/2024    4:59 AM 02/29/2024    4:34 AM 02/28/2024    6:21 PM  CMP  Glucose 70 - 99 mg/dL 89  85    BUN 6 - 20 mg/dL 11  7    Creatinine 9.38 - 1.24 mg/dL 9.15  9.20    Sodium 864 - 145 mmol/L 139  135    Potassium 3.5 - 5.1 mmol/L 3.9  3.7    Chloride 98 - 111 mmol/L 104  104    CO2 22 - 32 mmol/L 26  22    Calcium 8.9 - 10.3 mg/dL 8.9  8.5    Total Protein 6.5 - 8.1 g/dL 7.1  6.8  6.9   Total Bilirubin 0.0 - 1.2 mg/dL 0.7  1.2  1.7   Alkaline Phos 38 - 126 U/L 173  190  194   AST 15 - 41 U/L 48  91  151   ALT 0 - 44 U/L 123  152  160      Pertinent Imaging today Plain films and CT images have been personally visualized and interpreted; radiology reports have been reviewed. Decision making incorporated into the Impression /   US  Abdomen Limited RUQ (LIVER/GB) Result Date: 02/27/2024 EXAM: Right Upper Quadrant Abdominal Ultrasound 02/27/2024 02:19:22 PM TECHNIQUE: Real-time ultrasonography of the right upper quadrant of the abdomen was performed. COMPARISON: None available. CLINICAL HISTORY: Abdominal pain. FINDINGS: LIVER: The liver demonstrates normal echogenicity. No intrahepatic biliary ductal dilatation. No evidence of mass. Patent portal vein with antegrade flow. BILIARY SYSTEM: No pericholecystic fluid or wall thickening. No cholelithiasis. Negative sonographic Murphy's sign. Common bile duct is within normal limits measuring 2 mm. OTHER: No right upper quadrant ascites. IMPRESSION: 1. No acute findings. Electronically signed by: Norman Gatlin MD 02/27/2024 02:48 PM EST RP Workstation: [id number]   DG Chest Portable 1 View Result Date: 02/27/2024 CLINICAL DATA:  Shortness of breath, cough, and fever. EXAM: PORTABLE CHEST 1 VIEW COMPARISON:  None Available. FINDINGS: The heart size and mediastinal contours are within normal limits. Lung volumes are low with mild atelectasis at the lung bases. No effusion or pneumothorax is seen. No acute osseous  abnormality. IMPRESSION: Low lung volumes with atelectasis at the lung bases. Electronically Signed   By: Leita Birmingham M.D.   On: 02/27/2024 14:10    I spent 50 minutes involved in face-to-face and non-face-to-face activities for this patient on the day of the visit. Professional time spent includes the following activities: Preparing to see the patient (review of tests), Obtaining and reviewing separately obtained history (hospitalist progress note), Performing a medically appropriate examination and evaluation , Ordering medications/labs, referring and  communicating with other health care professionals, Documenting clinical information in the EMR, Independently interpreting results (not separately reported), Communicating results to the patient, Counseling and educating the patient and Care coordination (not separately reported).   Plan d/w requesting provider as well as ID pharm D  Of note, portions of this note may have been created with voice recognition software. While this note has been edited for accuracy, occasional wrong-word or 'sound-a-like' substitutions may have occurred due to the inherent limitations of voice recognition software.   Electronically signed by:   Annalee Orem, MD Infectious Disease Physician [facility] for Infectious Disease Pager: [phone]

## 2024-03-01 NOTE — Discharge Summary (Signed)
 Physician Discharge Summary   Sean Weiss FMW:[id number] DOB: 10-29-1990 DOA: 02/27/2024  PCP: Patient, No Pcp Per  Admit date: 02/27/2024 Discharge date: 03/01/2024  Admitted From: Home Disposition:  Home Discharging physician: Alm Apo, MD Barriers to discharge: none  Discharge Condition: stable CODE STATUS: Full  Diet recommendation:  Diet Orders (From admission, onward)     Start     Ordered   03/01/24 0000  Diet general        03/01/24 1341   02/27/24 1701  Diet regular Room service appropriate? Yes; Fluid consistency: Thin  Diet effective now       Question Answer Comment  Room service appropriate? Yes   Fluid consistency: Thin      02/27/24 1700            Hospital Course: Sean Weiss is a 33 y.o. male without significant past medical history who presented with intermittent fever, myalgia, and jaundice after a 84-month stay in the [place] diagnosed with suspected malaria.   States that he went to visit the [place] for the first time in a decade, since he moved here with his family.  On his return trip a couple of weeks ago, he started to feel bad, with generalized malaise, some abdominal discomfort, and intermittent fevers.   Workup in the ER shows some mild renal insufficiency, bilirubinemia and abnormal LFTs.  Peripheral smear was reviewed by ID, and is concerning for malaria.  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 He was admitted for ID evaluation and treatment.  Assessment and Plan: * Malaria - Recent trip to the [place] and upon returning had symptoms of fever, lethargy, malaise, abdominal pain.  No reports of bleeding -Blood parasite exam screen positive for Plasmodium.  Sent to reference lab for further testing -Peripheral smear also evaluated by ID - Artesunate started on admission per ID - Transitioned to Malarone to complete course per ID -Empirically treated with Rocephin; blood cultures remained negative -Mild anemia  noted on workup. -Hepatitis panel negative.  Total bili 4.2 on admission   The patient's acute and chronic medical conditions were treated accordingly. On day of discharge, patient was felt deemed stable for discharge. Patient/family member advised to call PCP or come back to ER if needed.   Principal Diagnosis: Malaria  Discharge Diagnoses: Active Hospital Problems   Diagnosis Date Noted   Malaria 02/27/2024    Priority: 1.    Resolved Hospital Problems  No resolved problems to display.     Discharge Instructions     Diet general   Complete by: As directed    Increase activity slowly   Complete by: As directed       Allergies as of 03/01/2024   No Known Allergies      Medication List     STOP taking these medications    loperamide  2 MG capsule Commonly known as: IMODIUM        TAKE these medications    atovaquone-proguanil 250-100 MG Tabs tablet Commonly known as: Malarone Take 4 tablets by mouth daily for 3 days.        No Known Allergies  Consultations: ID  Procedures:   Discharge Exam: BP 128/81 (BP Location: Right Arm)   Pulse 67   Temp 98.4 F (36.9 C) (Oral)   Resp 16   Wt 102.1 kg   SpO2 99%  Physical Exam Constitutional:      General: He is not in acute distress.    Appearance: Normal appearance.  HENT:     Head: Normocephalic and atraumatic.     Mouth/Throat:     Mouth: Mucous membranes are moist.  Eyes:     General: Scleral icterus present.     Extraocular Movements: Extraocular movements intact.  Cardiovascular:     Rate and Rhythm: Normal rate and regular rhythm.  Pulmonary:     Effort: Pulmonary effort is normal. No respiratory distress.     Breath sounds: Normal breath sounds. No wheezing.  Abdominal:     General: Bowel sounds are normal. There is no distension.     Palpations: Abdomen is soft.     Tenderness: There is no abdominal tenderness.  Musculoskeletal:        General: Normal range of motion.     Cervical  back: Normal range of motion and neck supple.  Skin:    General: Skin is warm and dry.  Neurological:     General: No focal deficit present.     Mental Status: He is alert.  Psychiatric:        Mood and Affect: Mood normal.        Behavior: Behavior normal.      The results of significant diagnostics from this hospitalization (including imaging, microbiology, ancillary and laboratory) are listed below for reference.   Microbiology: Recent Results (from the past 240 hours)  Blood culture (routine x 2)     Status: None (Preliminary result)   Collection Time: 02/27/24  1:40 PM   Specimen: BLOOD  Result Value Ref Range Status   Specimen Description   Final    BLOOD LEFT ANTECUBITAL Performed at [facility], [address], [place], [place] [address]    Special Requests   Final    BOTTLES DRAWN AEROBIC AND ANAEROBIC Blood Culture adequate volume Performed at [facility], [address] [place], [place] [address]    Culture   Final    NO GROWTH 3 DAYS Performed at [facility], [address], [place], [place] [address]    Report Status PENDING  Incomplete  Blood culture (routine x 2)     Status: None (Preliminary result)   Collection Time: 02/27/24  1:41 PM   Specimen: BLOOD  Result Value Ref Range Status   Specimen Description   Final    BLOOD RIGHT ANTECUBITAL Performed at [facility], [address] [place], [place] [address]    Special Requests   Final    BOTTLES DRAWN AEROBIC AND ANAEROBIC Blood Culture adequate volume Performed at [facility], [address] [place], [place] [address]    Culture   Final    NO GROWTH 3 DAYS Performed at [facility], [address], [place], [place] [address]    Report Status PENDING  Incomplete  Resp panel by RT-PCR (RSV, Flu A&B, Covid) Anterior Nasal Swab     Status: None   Collection Time: 02/27/24  1:51 PM   Specimen: Anterior Nasal  Swab  Result Value Ref Range Status   SARS Coronavirus 2 by RT PCR NEGATIVE NEGATIVE Final    Comment: (NOTE) SARS-CoV-2 target nucleic acids are NOT DETECTED.  The SARS-CoV-2 RNA is generally detectable in upper respiratory specimens during the acute phase of infection. The lowest concentration of SARS-CoV-2 viral copies this assay can detect is 138 copies/mL. A negative result does not preclude SARS-Cov-2 infection and should not be used as the sole basis for treatment or other patient management decisions.  A negative result may occur with  improper specimen collection/handling, submission of specimen other than nasopharyngeal swab, presence of viral mutation(s) within the areas targeted by this assay, and inadequate number of viral copies(<138 copies/mL). A negative result must be combined with clinical observations, patient history, and epidemiological information. The expected result is Negative.  Fact Sheet for Patients:  bloggercourse.com  Fact Sheet for Healthcare Providers:  seriousbroker.it  This test is no t yet approved or cleared by the [place]  FDA and  has been authorized for detection and/or diagnosis of SARS-CoV-2 by FDA under an Emergency Use Authorization (EUA). This EUA will remain  in effect (meaning this test can be used) for the duration of the COVID-19 declaration under Section 564(b)(1) of the Act, 21 U.S.C.section 360bbb-3(b)(1), unless the authorization is terminated  or revoked sooner.       Influenza A by PCR NEGATIVE NEGATIVE Final   Influenza B by PCR NEGATIVE NEGATIVE Final    Comment: (NOTE) The Xpert Xpress SARS-CoV-2/FLU/RSV plus assay is intended as an aid in the diagnosis of influenza from Nasopharyngeal swab specimens and should not be used as a sole basis for treatment. Nasal washings and aspirates are unacceptable for Xpert Xpress SARS-CoV-2/FLU/RSV testing.  Fact Sheet for  Patients: bloggercourse.com  Fact Sheet for Healthcare Providers: seriousbroker.it  This test is not yet approved or cleared by the [place]  FDA and has been authorized for detection and/or diagnosis of SARS-CoV-2 by FDA under an Emergency Use Authorization (EUA). This EUA will remain in effect (meaning this test can be used) for the duration of the COVID-19 declaration under Section 564(b)(1) of the Act, 21 U.S.C. section 360bbb-3(b)(1), unless the authorization is terminated or revoked.     Resp Syncytial Virus by PCR NEGATIVE NEGATIVE Final    Comment: (NOTE) Fact Sheet for Patients: bloggercourse.com  Fact Sheet for Healthcare Providers: seriousbroker.it  This test is not yet approved or cleared by the [place]  FDA and has been authorized for detection and/or diagnosis of SARS-CoV-2 by FDA under an Emergency Use Authorization (EUA). This EUA will remain in effect (meaning this test can be used) for the duration of the COVID-19 declaration under Section 564(b)(1) of the Act, 21 U.S.C. section 360bbb-3(b)(1), unless the authorization is terminated or revoked.  Performed at [facility], [address] [place], [place] [address]      Labs: BNP (last 3 results) No results for input(s): BNP in the last 8760 hours. Basic Metabolic Panel: Recent Labs  Lab 02/27/24 1351 02/28/24 0543 02/29/24 0434 03/01/24 0459  NA 133* 135 135 139  K 3.5 3.5 3.7 3.9  CL 101 105 104 104  CO2 20* 21* 22 26  GLUCOSE 127* 101* 85 89  BUN 15 10 7 11   CREATININE 1.25* 0.79 0.79 0.84  CALCIUM 9.2 8.1* 8.5* 8.9  MG  --   --  2.1 2.4   Liver Function Tests: Recent Labs  Lab 02/27/24 1351 02/28/24 1821 02/29/24 0434 03/01/24 0459  AST 53* 151* 91* 48*  ALT 111* 160* 152* 123*  ALKPHOS 184* 194* 190* 173*  BILITOT 4.2* 1.7* 1.2 0.7  PROT 7.9 6.9 6.8 7.1   ALBUMIN 3.6 3.1* 3.0* 3.2*   No results for input(s): LIPASE, AMYLASE in the last 168 hours. No results for input(s): AMMONIA in the last 168 hours. CBC: Recent Labs  Lab 02/27/24 1351 02/28/24 0543 02/29/24 0434 03/01/24 0459  WBC 6.7 6.7 6.6 7.4  NEUTROABS 5.4  --  2.1 2.4  HGB 13.8 12.0* 11.5* 11.9*  HCT 42.2 36.2* 35.2* 35.1*  MCV 80.7 81.7 81.3 80.7  PLT 149* 106* 120* 185   Cardiac Enzymes: No results for input(s): CKTOTAL, CKMB, CKMBINDEX, TROPONINI in the last 168 hours. BNP: Invalid input(s): POCBNP CBG: No results for input(s): GLUCAP in the last 168 hours. D-Dimer No results for input(s): DDIMER in the last 72 hours. Hgb A1c No results for input(s): HGBA1C in the last 72 hours. Lipid Profile No results for input(s): CHOL, HDL, LDLCALC, TRIG, CHOLHDL, LDLDIRECT in the last 72 hours. Thyroid function studies No results for input(s): TSH, T4TOTAL, T3FREE, THYROIDAB in the last 72 hours.  Invalid input(s): FREET3 Anemia work up No results for input(s): VITAMINB12, FOLATE, FERRITIN, TIBC, IRON, RETICCTPCT in the last 72 hours. Urinalysis No results found for: COLORURINE, APPEARANCEUR, LABSPEC, PHURINE, GLUCOSEU, HGBUR, BILIRUBINUR, KETONESUR, PROTEINUR, UROBILINOGEN, NITRITE, LEUKOCYTESUR Sepsis Labs Recent Labs  Lab 02/27/24 1351 02/28/24 0543 02/29/24 0434 03/01/24 0459  WBC 6.7 6.7 6.6 7.4   Microbiology Recent Results (from the past 240 hours)  Blood culture (routine x 2)     Status: None (Preliminary result)   Collection Time: 02/27/24  1:40 PM   Specimen: BLOOD  Result Value Ref Range Status   Specimen Description   Final    BLOOD LEFT ANTECUBITAL Performed at [facility], [address] [place], [place] [address]    Special Requests   Final    BOTTLES DRAWN AEROBIC AND ANAEROBIC Blood Culture adequate volume Performed at [facility], [address], [place], [place] [address]    Culture   Final    NO GROWTH 3 DAYS Performed at [facility], [address] [place], [place] [address]    Report Status PENDING  Incomplete  Blood culture (routine x 2)     Status: None (Preliminary result)   Collection Time: 02/27/24  1:41 PM   Specimen: BLOOD  Result Value Ref Range Status   Specimen Description   Final    BLOOD RIGHT ANTECUBITAL Performed at [facility], [address] [place], [place] [address]    Special Requests   Final    BOTTLES DRAWN AEROBIC AND ANAEROBIC Blood Culture adequate volume Performed at [facility], [address], [place], [place] [address]    Culture   Final    NO GROWTH 3 DAYS Performed at [facility], [address], [place], [place] [address]    Report Status PENDING  Incomplete  Resp panel by RT-PCR (RSV, Flu A&B, Covid) Anterior Nasal Swab     Status: None   Collection Time: 02/27/24  1:51 PM   Specimen: Anterior Nasal Swab  Result Value Ref Range Status   SARS Coronavirus 2 by RT PCR NEGATIVE NEGATIVE Final    Comment: (NOTE) SARS-CoV-2 target nucleic acids are NOT DETECTED.  The SARS-CoV-2 RNA is generally detectable in upper respiratory specimens during the acute phase of infection. The lowest concentration of SARS-CoV-2 viral copies this assay can detect is 138 copies/mL. A negative result does not preclude SARS-Cov-2 infection and should not be used as the sole basis for treatment or other patient management decisions. A negative result may occur with  improper specimen collection/handling, submission of specimen other than nasopharyngeal swab, presence of viral mutation(s) within the areas targeted by this assay, and inadequate number of viral copies(<138 copies/mL). A negative result must be combined with clinical observations, patient history, and epidemiological information. The expected  result is  Negative.  Fact Sheet for Patients:  bloggercourse.com  Fact Sheet for Healthcare Providers:  seriousbroker.it  This test is no t yet approved or cleared by the [place]  FDA and  has been authorized for detection and/or diagnosis of SARS-CoV-2 by FDA under an Emergency Use Authorization (EUA). This EUA will remain  in effect (meaning this test can be used) for the duration of the COVID-19 declaration under Section 564(b)(1) of the Act, 21 U.S.C.section 360bbb-3(b)(1), unless the authorization is terminated  or revoked sooner.       Influenza A by PCR NEGATIVE NEGATIVE Final   Influenza B by PCR NEGATIVE NEGATIVE Final    Comment: (NOTE) The Xpert Xpress SARS-CoV-2/FLU/RSV plus assay is intended as an aid in the diagnosis of influenza from Nasopharyngeal swab specimens and should not be used as a sole basis for treatment. Nasal washings and aspirates are unacceptable for Xpert Xpress SARS-CoV-2/FLU/RSV testing.  Fact Sheet for Patients: bloggercourse.com  Fact Sheet for Healthcare Providers: seriousbroker.it  This test is not yet approved or cleared by the [place]  FDA and has been authorized for detection and/or diagnosis of SARS-CoV-2 by FDA under an Emergency Use Authorization (EUA). This EUA will remain in effect (meaning this test can be used) for the duration of the COVID-19 declaration under Section 564(b)(1) of the Act, 21 U.S.C. section 360bbb-3(b)(1), unless the authorization is terminated or revoked.     Resp Syncytial Virus by PCR NEGATIVE NEGATIVE Final    Comment: (NOTE) Fact Sheet for Patients: bloggercourse.com  Fact Sheet for Healthcare Providers: seriousbroker.it  This test is not yet approved or cleared by the [place]  FDA and has been authorized for detection and/or diagnosis of  SARS-CoV-2 by FDA under an Emergency Use Authorization (EUA). This EUA will remain in effect (meaning this test can be used) for the duration of the COVID-19 declaration under Section 564(b)(1) of the Act, 21 U.S.C. section 360bbb-3(b)(1), unless the authorization is terminated or revoked.  Performed at [facility], [address], [place], [place] [address]     Procedures/Studies: US  Abdomen Limited RUQ (LIVER/GB) Result Date: 02/27/2024 EXAM: Right Upper Quadrant Abdominal Ultrasound 02/27/2024 02:19:22 PM TECHNIQUE: Real-time ultrasonography of the right upper quadrant of the abdomen was performed. COMPARISON: None available. CLINICAL HISTORY: Abdominal pain. FINDINGS: LIVER: The liver demonstrates normal echogenicity. No intrahepatic biliary ductal dilatation. No evidence of mass. Patent portal vein with antegrade flow. BILIARY SYSTEM: No pericholecystic fluid or wall thickening. No cholelithiasis. Negative sonographic Murphy's sign. Common bile duct is within normal limits measuring 2 mm. OTHER: No right upper quadrant ascites. IMPRESSION: 1. No acute findings. Electronically signed by: Norman Gatlin MD 02/27/2024 02:48 PM EST RP Workstation: [id number]   DG Chest Portable 1 View Result Date: 02/27/2024 CLINICAL DATA:  Shortness of breath, cough, and fever. EXAM: PORTABLE CHEST 1 VIEW COMPARISON:  None Available. FINDINGS: The heart size and mediastinal contours are within normal limits. Lung volumes are low with mild atelectasis at the lung bases. No effusion or pneumothorax is seen. No acute osseous abnormality. IMPRESSION: Low lung volumes with atelectasis at the lung bases. Electronically Signed   By: Leita Birmingham M.D.   On: 02/27/2024 14:10     Time coordinating discharge: Over 30 minutes    Alm Apo, MD  [facility] 03/01/2024, 1:42 PM

## 2024-03-01 NOTE — Progress Notes (Signed)
 Lab states she is reordering the lavender top parasite lab draw. She states it has to be put on a slide within 30 min of the lab being collected, so she is unable to use the one collected this morning.

## 2024-03-01 NOTE — Progress Notes (Signed)
 Sean Weiss to be discharged home per MD order. Discussed with the patient and all questions fully answered. Medications delivered to bedside. Skin clean, dry, and intact without evidence of skin break down. IV catheters discontinued intact. Site without signs and symptoms of complications. Dressing and pressure applied.  An After Visit Summary was printed and given to the patient.   Sean Weiss  03/01/2024

## 2024-03-02 LAB — PARASITE EXAM SCREEN, BLOOD-W CONF TO LABCORP (NOT @ ARMC)

## 2024-03-03 LAB — CULTURE, BLOOD (ROUTINE X 2)
Culture: NO GROWTH
Culture: NO GROWTH
Special Requests: ADEQUATE
Special Requests: ADEQUATE

## 2024-03-04 ENCOUNTER — Inpatient Hospital Stay: Payer: Self-pay | Admitting: Internal Medicine

## 2024-03-08 LAB — PLASMODIUM SP. REFLEX
P. falciparum: POSITIVE — AB
P. knowlesi: NEGATIVE
P. malariae: NEGATIVE
P. ovale: NEGATIVE
P. vivax: NEGATIVE

## 2024-03-08 LAB — MISC LABCORP TEST (SEND OUT)
LabCorp test name: 819213
Labcorp test code: 819213

## 2024-03-08 LAB — PLASMODIUM SP. PCR: Plasmodium Sp. PCR: POSITIVE — AB

## 2024-03-10 LAB — PARASITE EXAM, BLOOD

## 2024-03-14 LAB — PARASITE EXAM, BLOOD: Parasite Exam, Blood: POSITIVE — AB

## 2024-03-15 ENCOUNTER — Telehealth: Payer: Self-pay

## 2024-03-15 ENCOUNTER — Inpatient Hospital Stay: Payer: Self-pay | Admitting: Infectious Diseases

## 2024-03-15 NOTE — Telephone Encounter (Signed)
 Patient aware

## 2024-03-15 NOTE — Telephone Encounter (Signed)
 Patient called asking why he needed to follow up. Patient stated that he was fine and completed medication.
